# Patient Record
Sex: Male | Born: 1945 | Race: Asian | Hispanic: No | Marital: Married | Smoking: Former smoker
Health system: Southern US, Community
[De-identification: ages and names within clinical notes are randomized; demographics above are authoritative.]

## PROBLEM LIST (undated history)

## (undated) DIAGNOSIS — B1920 Unspecified viral hepatitis C without hepatic coma: Secondary | ICD-10-CM

## (undated) DIAGNOSIS — I4891 Unspecified atrial fibrillation: Secondary | ICD-10-CM

## (undated) DIAGNOSIS — N179 Acute kidney failure, unspecified: Secondary | ICD-10-CM

## (undated) DIAGNOSIS — K625 Hemorrhage of anus and rectum: Secondary | ICD-10-CM

## (undated) DIAGNOSIS — C220 Liver cell carcinoma: Secondary | ICD-10-CM

## (undated) DIAGNOSIS — J90 Pleural effusion, not elsewhere classified: Secondary | ICD-10-CM

## (undated) DIAGNOSIS — I10 Essential (primary) hypertension: Secondary | ICD-10-CM

## (undated) DIAGNOSIS — I059 Rheumatic mitral valve disease, unspecified: Secondary | ICD-10-CM

## (undated) DIAGNOSIS — G4733 Obstructive sleep apnea (adult) (pediatric): Secondary | ICD-10-CM

## (undated) DIAGNOSIS — R0600 Dyspnea, unspecified: Secondary | ICD-10-CM

## (undated) DIAGNOSIS — N529 Male erectile dysfunction, unspecified: Secondary | ICD-10-CM

## (undated) DIAGNOSIS — I080 Rheumatic disorders of both mitral and aortic valves: Secondary | ICD-10-CM

## (undated) DIAGNOSIS — I714 Abdominal aortic aneurysm, without rupture, unspecified: Secondary | ICD-10-CM

## (undated) DIAGNOSIS — I429 Cardiomyopathy, unspecified: Secondary | ICD-10-CM

## (undated) DIAGNOSIS — I5021 Acute systolic (congestive) heart failure: Secondary | ICD-10-CM

## (undated) DIAGNOSIS — Z7901 Long term (current) use of anticoagulants: Secondary | ICD-10-CM

## (undated) DIAGNOSIS — N400 Enlarged prostate without lower urinary tract symptoms: Secondary | ICD-10-CM

## (undated) DIAGNOSIS — Z8719 Personal history of other diseases of the digestive system: Secondary | ICD-10-CM

## (undated) DIAGNOSIS — R319 Hematuria, unspecified: Secondary | ICD-10-CM

## (undated) DIAGNOSIS — K219 Gastro-esophageal reflux disease without esophagitis: Secondary | ICD-10-CM

## (undated) DIAGNOSIS — R063 Periodic breathing: Secondary | ICD-10-CM

## (undated) HISTORY — DX: Unspecified atrial fibrillation: I48.91

## (undated) HISTORY — DX: Essential (primary) hypertension: I10

## (undated) HISTORY — DX: Hematuria, unspecified: R31.9

## (undated) HISTORY — DX: Liver cell carcinoma: C22.0

## (undated) HISTORY — PX: AORTIC VALVE REPLACEMENT: SHX41

## (undated) HISTORY — DX: Rheumatic mitral valve disease, unspecified: I05.9

## (undated) HISTORY — DX: Personal history of other diseases of the digestive system: Z87.19

## (undated) HISTORY — DX: Pleural effusion, not elsewhere classified: J90

## (undated) HISTORY — DX: Unspecified viral hepatitis C without hepatic coma: B19.20

## (undated) HISTORY — DX: Cardiomyopathy, unspecified: I42.9

## (undated) HISTORY — DX: Rheumatic disorders of both mitral and aortic valves: I08.0

## (undated) HISTORY — DX: Acute systolic (congestive) heart failure: I50.21

## (undated) HISTORY — DX: Abdominal aortic aneurysm, without rupture: I71.4

## (undated) HISTORY — DX: Gastro-esophageal reflux disease without esophagitis: K21.9

## (undated) HISTORY — DX: Male erectile dysfunction, unspecified: N52.9

## (undated) HISTORY — DX: Abdominal aortic aneurysm, without rupture, unspecified: I71.40

## (undated) HISTORY — DX: Acute kidney failure, unspecified: N17.9

## (undated) HISTORY — DX: Periodic breathing: R06.3

## (undated) HISTORY — DX: Long term (current) use of anticoagulants: Z79.01

## (undated) HISTORY — DX: Benign prostatic hyperplasia without lower urinary tract symptoms: N40.0

## (undated) HISTORY — DX: Obstructive sleep apnea (adult) (pediatric): G47.33

## (undated) HISTORY — DX: Hemorrhage of anus and rectum: K62.5

## (undated) HISTORY — DX: Dyspnea, unspecified: R06.00

---

## 2010-09-27 HISTORY — PX: OTHER SURGICAL HISTORY: SHX169

## 2011-05-19 DIAGNOSIS — I059 Rheumatic mitral valve disease, unspecified: Secondary | ICD-10-CM

## 2011-05-19 DIAGNOSIS — I359 Nonrheumatic aortic valve disorder, unspecified: Secondary | ICD-10-CM

## 2011-06-01 ENCOUNTER — Encounter: Payer: Self-pay | Admitting: Thoracic Surgery (Cardiothoracic Vascular Surgery)

## 2011-06-16 DIAGNOSIS — I359 Nonrheumatic aortic valve disorder, unspecified: Secondary | ICD-10-CM

## 2011-06-23 ENCOUNTER — Other Ambulatory Visit: Payer: Self-pay | Admitting: Physician Assistant

## 2011-06-23 DIAGNOSIS — F5102 Adjustment insomnia: Secondary | ICD-10-CM

## 2011-06-23 NOTE — Telephone Encounter (Signed)
Pt called requesting medication for sleep. Rx called to [facility] [address] [place] for Ambien 10mg  #30 1 po qhs PRN with no refill.

## 2011-07-14 ENCOUNTER — Ambulatory Visit (INDEPENDENT_AMBULATORY_CARE_PROVIDER_SITE_OTHER): Payer: Medicare Other | Admitting: Thoracic Surgery (Cardiothoracic Vascular Surgery)

## 2011-07-14 DIAGNOSIS — Z952 Presence of prosthetic heart valve: Secondary | ICD-10-CM

## 2011-07-14 DIAGNOSIS — I359 Nonrheumatic aortic valve disorder, unspecified: Secondary | ICD-10-CM

## 2011-07-14 NOTE — Progress Notes (Signed)
The patient is doing quite well and has had no problems since discharge. His breathing is much improved. He is walking for exercise. His sternum is stable and his wounds are well healed.He is walking for exercise.  Sternal precautions were reviewed with the patient and his son. Will continue follow up with cardiology.RTC prn

## 2012-08-13 ENCOUNTER — Other Ambulatory Visit: Payer: Self-pay | Admitting: Internal Medicine

## 2012-12-15 ENCOUNTER — Other Ambulatory Visit: Payer: Self-pay | Admitting: Gastroenterology

## 2012-12-15 DIAGNOSIS — C220 Liver cell carcinoma: Secondary | ICD-10-CM

## 2012-12-21 ENCOUNTER — Ambulatory Visit
Admission: RE | Admit: 2012-12-21 | Discharge: 2012-12-21 | Disposition: A | Discharge status: Home / Self Care | Payer: Medicare HMO | Source: Ambulatory Visit | Attending: Gastroenterology | Admitting: Gastroenterology

## 2012-12-21 DIAGNOSIS — C220 Liver cell carcinoma: Secondary | ICD-10-CM

## 2012-12-22 ENCOUNTER — Telehealth: Payer: Self-pay | Admitting: Emergency Medicine

## 2012-12-22 NOTE — Telephone Encounter (Signed)
LM FOR RN TO CALL BACK TO SEE IF PT CAN STOP HIS COUMADIN X4D FOR LIVER BX.  12-25-12 KEVIN, PHARM-D CALLED  FROM DR [facility] OFFICE  TO MAKE [place] AWARE THAT PT. CAN STOP COUMADIN BUT WILL NEED TO Jyles BRIDGED TO LOVENOX INJECTIONS.  I WILL CALL KEVIN BACK WHEN THE LIVER BX IS SCHEDULED.

## 2012-12-25 ENCOUNTER — Telehealth: Payer: Self-pay | Admitting: Emergency Medicine

## 2012-12-25 ENCOUNTER — Other Ambulatory Visit (HOSPITAL_COMMUNITY): Payer: Self-pay | Admitting: Interventional Radiology

## 2012-12-25 DIAGNOSIS — R160 Hepatomegaly, not elsewhere classified: Secondary | ICD-10-CM

## 2012-12-25 DIAGNOSIS — R772 Abnormality of alphafetoprotein: Secondary | ICD-10-CM

## 2012-12-25 DIAGNOSIS — B182 Chronic viral hepatitis C: Secondary | ICD-10-CM

## 2012-12-25 NOTE — Telephone Encounter (Signed)
LM FOR PT TO CALL BACK TO GET THE INFORMATION AND APPT. FOR LIVER BX- SCHEDULED AT [facility] ON 01-01-13 AT 1000AM AND [place] AT SHORT STAY AT 830AM.  NPO AFTER MIDNIGHT AND CARDIO OFFICE WILL CALL WITH INSTRUCTIONS FOR LOVENOX.    1400- CALLED LUWANDA TO SCHEDULED AN INTERPRETER FOR BX PROCEDURE   1410P- LM W/ PEGGY IN THE COUMADIN CLINIC ([phone])WITH THE LIVER BX DATE/TIME  1445- spoke with Orvilla Fus- Son and gave him instructions and appt. For Bx , also told him that an interpreter will Bradely there for the procedure  For liability reasons.

## 2012-12-28 ENCOUNTER — Other Ambulatory Visit: Payer: Self-pay | Admitting: Radiology

## 2012-12-29 ENCOUNTER — Encounter (INDEPENDENT_AMBULATORY_CARE_PROVIDER_SITE_OTHER): Payer: Self-pay

## 2013-01-01 ENCOUNTER — Ambulatory Visit (HOSPITAL_COMMUNITY)
Admission: RE | Admit: 2013-01-01 | Discharge: 2013-01-01 | Disposition: A | Discharge status: Home / Self Care | Payer: Medicare HMO | Source: Ambulatory Visit | Attending: Interventional Radiology | Admitting: Interventional Radiology

## 2013-01-01 ENCOUNTER — Encounter (HOSPITAL_COMMUNITY): Payer: Self-pay

## 2013-01-01 DIAGNOSIS — K219 Gastro-esophageal reflux disease without esophagitis: Secondary | ICD-10-CM | POA: Insufficient documentation

## 2013-01-01 DIAGNOSIS — I080 Rheumatic disorders of both mitral and aortic valves: Secondary | ICD-10-CM | POA: Insufficient documentation

## 2013-01-01 DIAGNOSIS — I10 Essential (primary) hypertension: Secondary | ICD-10-CM | POA: Insufficient documentation

## 2013-01-01 DIAGNOSIS — R109 Unspecified abdominal pain: Secondary | ICD-10-CM | POA: Insufficient documentation

## 2013-01-01 DIAGNOSIS — B1920 Unspecified viral hepatitis C without hepatic coma: Secondary | ICD-10-CM | POA: Insufficient documentation

## 2013-01-01 DIAGNOSIS — K7689 Other specified diseases of liver: Secondary | ICD-10-CM | POA: Insufficient documentation

## 2013-01-01 DIAGNOSIS — I714 Abdominal aortic aneurysm, without rupture, unspecified: Secondary | ICD-10-CM | POA: Insufficient documentation

## 2013-01-01 DIAGNOSIS — I4891 Unspecified atrial fibrillation: Secondary | ICD-10-CM | POA: Insufficient documentation

## 2013-01-01 DIAGNOSIS — C228 Malignant neoplasm of liver, primary, unspecified as to type: Secondary | ICD-10-CM | POA: Insufficient documentation

## 2013-01-01 DIAGNOSIS — R772 Abnormality of alphafetoprotein: Secondary | ICD-10-CM

## 2013-01-01 DIAGNOSIS — B182 Chronic viral hepatitis C: Secondary | ICD-10-CM

## 2013-01-01 DIAGNOSIS — R160 Hepatomegaly, not elsewhere classified: Secondary | ICD-10-CM

## 2013-01-01 DIAGNOSIS — N400 Enlarged prostate without lower urinary tract symptoms: Secondary | ICD-10-CM | POA: Insufficient documentation

## 2013-01-01 LAB — CBC
Platelets: 179 10*3/uL (ref 150–400)
RBC: 4.21 MIL/uL — ABNORMAL LOW (ref 4.22–5.81)
WBC: 8.4 10*3/uL (ref 4.0–10.5)

## 2013-01-01 LAB — APTT: aPTT: 31 seconds (ref 24–37)

## 2013-01-01 LAB — PROTIME-INR: Prothrombin Time: 13.3 seconds (ref 11.6–15.2)

## 2013-01-01 MED ORDER — MIDAZOLAM HCL 2 MG/2ML IJ SOLN
INTRAMUSCULAR | Status: AC
  Filled 2013-01-01: qty 4

## 2013-01-01 MED ORDER — FENTANYL CITRATE 0.05 MG/ML IJ SOLN
INTRAMUSCULAR | Status: AC
  Filled 2013-01-01: qty 2

## 2013-01-01 MED ORDER — SODIUM CHLORIDE 0.9 % IV SOLN: Freq: Once | INTRAVENOUS | Status: DC

## 2013-01-01 MED ORDER — MIDAZOLAM HCL 2 MG/2ML IJ SOLN
INTRAMUSCULAR | Status: AC | PRN
  Administered 2013-01-01: 0.5 mg via INTRAVENOUS
  Administered 2013-01-01: 1 mg via INTRAVENOUS
  Administered 2013-01-01: 0.5 mg via INTRAVENOUS

## 2013-01-01 MED ORDER — FENTANYL CITRATE 0.05 MG/ML IJ SOLN
INTRAMUSCULAR | Status: AC | PRN
  Administered 2013-01-01 (×2): 25 ug via INTRAVENOUS
  Administered 2013-01-01: 50 ug via INTRAVENOUS

## 2013-01-01 NOTE — Procedures (Signed)
Technically successful US guided biopsy of hypoechoic lesion in inf aspect of the left lobe of the liver.  No immediate complications.

## 2013-01-01 NOTE — ED Notes (Signed)
Patient denies pain and is resting comfortably.  

## 2013-01-01 NOTE — H&P (Signed)
Glen Pitts is an 67 y.o. male.   Chief Complaint: Abd pain x 1 month; AFP elevated; liver lesion Was consulted with Dr Grace Isaac 12/21/12 Regarding treatment of liver lesion Scheduled now though for liver lesion biopsy first Possible Y90 or ablation scheduled for possible treatment of lesion HPI: non English speaking; has son and interpreter with him HTN; Hep C; BPH; AAA; Afib  Past Medical History  Diagnosis Date  . Hypertension   . Benign prostatic hypertrophy   . History of esophageal reflux   . Mitral and aortic regurgitation     History reviewed. No pertinent past surgical history.  No family history on file. Social History:  has no tobacco, alcohol, and drug history on file.  Allergies: No Known Allergies   (Not in a hospital admission)  No results found for this or any previous visit (from the past 48 hour(s)). No results found.  Review of Systems  Constitutional: Negative for fever and weight loss.  Respiratory: Negative for shortness of breath.   Cardiovascular: Negative for chest pain.  Gastrointestinal: Positive for abdominal pain. Negative for nausea and vomiting.  Musculoskeletal: Positive for back pain.  Neurological: Negative for dizziness, weakness and headaches.    Blood pressure 132/79, pulse 51, temperature 97.5 F (36.4 C), temperature source Oral, resp. rate 18, height 5\' 5"  (1.651 m), weight 150 lb (68.04 kg), SpO2 97.00%. Physical Exam  Constitutional: He is oriented to person, place, and time.  Cardiovascular: Normal rate, regular rhythm and normal heart sounds.   No murmur heard. Respiratory: Effort normal and breath sounds normal.  GI: Soft. Bowel sounds are normal. There is tenderness.  Musculoskeletal: Normal range of motion.  Neurological: He is alert and oriented to person, place, and time.  Does not speak [place]; uses interpreter  Skin: Skin is warm.  Psychiatric: He has a normal mood and affect. His behavior is normal. Judgment and thought  content normal.  Non English speaking; Son and interpreter with him     Assessment/Plan Liver lesion; abd pain Consulted with Dr Grace Isaac 3/27; for possible  treatment of lesion  Scheduled now though for liver lesion biopsy first Pt and family aware of procedure benefits and risks and agreeable to proceed Consent signed and in chart  Karsynn Deweese A 01/01/2013, 9:37 AM

## 2013-01-01 NOTE — ED Notes (Signed)
Requested bed from [facility]; spoke with Misty Stanley RN

## 2013-01-05 ENCOUNTER — Encounter (INDEPENDENT_AMBULATORY_CARE_PROVIDER_SITE_OTHER): Payer: Self-pay | Admitting: General Surgery

## 2013-01-05 ENCOUNTER — Telehealth (INDEPENDENT_AMBULATORY_CARE_PROVIDER_SITE_OTHER): Payer: Self-pay

## 2013-01-05 ENCOUNTER — Ambulatory Visit (INDEPENDENT_AMBULATORY_CARE_PROVIDER_SITE_OTHER): Payer: Medicaid Other | Admitting: General Surgery

## 2013-01-05 VITALS — BP 110/68 | HR 80 | Resp 18 | Ht 64.0 in | Wt 152.0 lb

## 2013-01-05 DIAGNOSIS — C228 Malignant neoplasm of liver, primary, unspecified as to type: Secondary | ICD-10-CM

## 2013-01-05 DIAGNOSIS — C220 Liver cell carcinoma: Secondary | ICD-10-CM

## 2013-01-05 NOTE — Telephone Encounter (Signed)
Contacted Dr. Radene Journey office to get name of this pt's cardiologist.  LM because office was closed.  Will call again next week.

## 2013-01-05 NOTE — Patient Instructions (Addendum)
We will fax letter to cardiologist.    If he says low risk for surgery, then we will schedule surgery.     Assuming we proceed with surgery.   Whatever the operative findings, I will discuss the case with your family after we are done in the operating room.  I will talk to you in the next few days when you are more awake.  I will see you in the hospital every weekday that I am not out of town.  My partners help see patients on the weekends and if I am out of town.    WHAT HAPPENS AFTER SURGERY: After surgery, you will go first to the recovery room, then to your room.   YOU WILL NOT Erol ABLE TO EAT FOR AT LEAST 1-3 DAYS AFTER SURGERY.  You will have a catheter in your bladder.  On your abdomen, you will have several surgical drains and possibly a pain pump with numbing medicine.  You will have compression stockings on your legs to decrease the risk of blood clots.    We will address your pain in several ways.  We will use an IV pain pump called a "PCA," or Patient Controlled Analgesia.  This allows you to press a button and immediately receive a dose of pain medication without waiting for a nurse.  We also use IV Tylenol and sometimes IV Toradol which is similar to ibuprofen.  You may also have a pump with numbing medicine delivered directly to your incision.  I use doses and medications that work for the majority of people, but you may need an adjustment to the dose or type of medicine if your pain is not adequately controlled.  Your throat may Nechemia sore, in which case you may need a throat spray or lozenges.    We will ask you to get out of bed the day after surgery in order to maximize your chances of not having complications.  Your risk of pneumonia and blood clots is lower with walking and sitting in the chair.  We will also ask you to perform breathing exercises.  We will also ask to you walk in your room and in the halls for the above reasons, but also in order for you to keep up your strength.     EATING: We will usually start you on clear liquids in around 1-2 days if your bowel function seems to have returned.  We advance your diet slowly to make sure you are tolerating each step.  All patients do not have a normal appetite when they go home and usually have to take 2-4 cans of nutritional supplement per day while this is improving.  Most patients also find that their taste buds do not seem the same right after surgery, and this can continue into the time of possible post operative chemotherapy and radiation.  Some patients develop diabetes and will need assistance from a primary care doctor for medication.     GOING HOME! Usually you are able to go home in 3-5 days, depending on whether or not complications happen and what is going on with your overall health status.   If you have more health problems or if you have limited help at home, the therapists and nurses may recommend a temporary rehab or nursing facility to help you get back on your feet before you go home.  These decisions would Rykker made while you are in the hospital with the assistance of a social worker or case  Production designer, theatre/television/film.    Please bring all insurance/disability forms to our office for the staff to fill out   POSSIBLE COMPLICATIONS This is a very extensive operation and includes complications listed below: Bleeding Infection and possible wound complications such as hernia Damage to adjacent structures Leak of bile from the surface of the liver Possible need for other procedures, such as abscess drains in radiology or endoscopy.   Possible prolonged hospital stay MOST PATIENTS' ENERGY LEVEL IS NOT BACK TO NORMAL FOR AT LEAST 4-6 MONTHS.  OLDER PATIENTS MAY FEEL WEAK FOR LONGER PERIODS OF TIME.   Difficulty with eating or post operative nausea (around 30%) Possible early recurrence of cancer Possible complications of your medical problems such as heart disease or arrhythmias. Death (less than 2%)  All possible  complications are not listed, just the most common.    FURTHER INFORMATION? Please ask questions if you find something that we did not discuss in the office and would like more information.  If you would like another appointment if you have many questions or if your family members would like to come as well, please contact the office.     IF YOU ARE TAKING ASPIRIN, PLAVIX, COUMADIN, OR OTHER BLOOD THINNERS, LET us KNOW IMMEDIATELY SO WE CAN CONTACT YOUR PRESCRIBING HEALTH CARE PROVIDER TO HOLD THE MEDICATION FOR 5-7 DAYS BEFORE SURGERY

## 2013-01-08 DIAGNOSIS — C220 Liver cell carcinoma: Secondary | ICD-10-CM | POA: Insufficient documentation

## 2013-01-08 NOTE — Progress Notes (Signed)
Chief Complaint  Patient presents with  . Other    Eval liver mass    HISTORY: Pt is 67 yo [place] male who presents with new left sided liver mass.  This was discovered on an abdominal ultrasound performed for abdominal discomfort.  He has history of hepatitis C.  He subsequently underwent MR demonstrating isolated liver mass in left lateral segment.  No other lesions have been seen.  He denies jaundice.  He has had some decreased appetite.  No N/v.  AFP elevated in the 900s.  Biopsy performed and was positive for HCC.  He has not had ascites or encephalopathy that he or his son is aware of.  He does have some cirrhosis.  He is on coumadin for mechanical valve.    Past Medical History  Diagnosis Date  . Hypertension   . Benign prostatic hypertrophy   . History of esophageal reflux   . Mitral and aortic regurgitation   Hepatitis C  Past Surgical History  Procedure Laterality Date  . Open heart surgery  2012    Current Outpatient Prescriptions  Medication Sig Dispense Refill  . amitriptyline (ELAVIL) 25 MG tablet Take 25 mg by mouth at bedtime.      . bimatoprost (LUMIGAN) 0.03 % ophthalmic solution Place 1 drop into both eyes at bedtime.      . dorzolamide-timolol (COSOPT) 22.3-6.8 MG/ML ophthalmic solution Place 1 drop into both eyes 2 (two) times daily.      . furosemide (LASIX) 20 MG tablet Take 20 mg by mouth 2 (two) times daily.      Marland Kitchen lisinopril (PRINIVIL,ZESTRIL) 5 MG tablet Take 5 mg by mouth daily.      . metoprolol succinate (TOPROL-XL) 25 MG 24 hr tablet Take 50 mg by mouth daily.      Marland Kitchen warfarin (COUMADIN) 2.5 MG tablet Take 1.25-2.5 mg by mouth daily. Takes 1.25 mg on Tues,Thurs, Sat.  Takes 2.5 mg all other days.       No current facility-administered medications for this visit.     No Known Allergies   History reviewed. No pertinent family history.   History   Social History  . Marital Status: Married    Spouse Name: N/A    Number of Children: N/A  .  Years of Education: N/A   Social History Main Topics  . Smoking status: Former Smoker -- 0 years    Types: Cigarettes  . Smokeless tobacco: None  . Alcohol Use: No  . Drug Use: No  . Sexually Active: None   Other Topics Concern  . None   Social History Narrative  . None     REVIEW OF SYSTEMS - PERTINENT POSITIVES ONLY: 12 point review of systems negative other than HPI and PMH except for headaches  EXAM: Filed Vitals:   01/05/13 0941  BP: 110/68  Pulse: 80  Resp: 18    Gen:  No acute distress.  Well nourished and well groomed.  Sl frail.   Neurological: Alert and oriented to person, place, and time. Coordination normal. Sl difficult via interpreter, but pt seems oriented.   Head: Normocephalic and atraumatic.  Eyes: Conjunctivae are normal. Pupils are equal, round, and reactive to light. No scleral icterus.  Neck: Normal range of motion. Neck supple. No tracheal deviation or thyromegaly present.  Cardiovascular: Normal rate, regular rhythm, normal heart sounds and intact distal pulses.  Exam reveals no gallop and no friction rub.  No murmur heard. Respiratory: Effort normal.  No respiratory distress.  No chest wall tenderness. Breath sounds normal.  No wheezes, rales or rhonchi.  GI: Soft. Bowel sounds are normal. The abdomen is soft and nontender.  There is no rebound and no guarding. No ascites evident.   Musculoskeletal: Sl antalgic gait. Extremities are nontender.  Lymphadenopathy: No cervical, preauricular, postauricular or axillary adenopathy is present Skin: Skin is warm and dry. No rash noted. No diaphoresis. No erythema. No pallor. No clubbing, cyanosis, or edema.   Psychiatric: Normal mood and affect. Behavior is normal. Judgment and thought content normal.    LABORATORY RESULTS: Available labs are reviewed  Path poorly differentiated hepatocellular carcinoma.     RADIOLOGY RESULTS: See E-Chart or I-Site for most recent results.  Images and reports are  reviewed.  [place] but images able to [place] reviewed.  2.9 cm lesion in left lateral segment.  Small simple cyst.  No other liver masses.    ASSESSMENT AND PLAN: Cancer, hepatocellular Pt has isolated left lateral segment lesion.   Discussed pros and cons of resection vs interventional technique.  Pt seems reasonable candidate for laparoscopic resection of lesion. Will get cardiology input.   If low to medium cardiac risk for surgery, will hold coumadin with lovenox bridge and plan resection.   Reviewed risk of bleeding (high), infection, damage to adjacent structures, liver failure, bile leak, ascites, cardiac complications, blood clot, wound issues.    I think patient would do poorly with large resection, but it only appears he would lose around 15% of liver to resect this lesion.   Once I hear from cardiology, I will schedule.       Maudry Diego MD Surgical Oncology, General and Endocrine Surgery [facility]    45 min spent with interpreter phone with discussion and exam.    Visit Diagnoses: 1. Cancer, hepatocellular     Primary Care Physician: Zoila Shutter, MD

## 2013-01-08 NOTE — Assessment & Plan Note (Signed)
Pt has isolated left lateral segment lesion.   Discussed pros and cons of resection vs interventional technique.  Pt seems reasonable candidate for laparoscopic resection of lesion. Will get cardiology input.   If low to medium cardiac risk for surgery, will hold coumadin with lovenox bridge and plan resection.   Reviewed risk of bleeding (high), infection, damage to adjacent structures, liver failure, bile leak, ascites, cardiac complications, blood clot, wound issues.    I think patient would do poorly with large resection, but it only appears he would lose around 15% of liver to resect this lesion.   Once I hear from cardiology, I will schedule.

## 2013-01-11 ENCOUNTER — Encounter (INDEPENDENT_AMBULATORY_CARE_PROVIDER_SITE_OTHER): Payer: Self-pay

## 2013-02-06 ENCOUNTER — Encounter (INDEPENDENT_AMBULATORY_CARE_PROVIDER_SITE_OTHER): Payer: Self-pay

## 2013-02-21 ENCOUNTER — Other Ambulatory Visit: Payer: Self-pay | Admitting: Internal Medicine

## 2013-02-21 DIAGNOSIS — C228 Malignant neoplasm of liver, primary, unspecified as to type: Secondary | ICD-10-CM

## 2013-03-06 ENCOUNTER — Other Ambulatory Visit: Payer: Self-pay | Admitting: Emergency Medicine

## 2013-03-06 ENCOUNTER — Ambulatory Visit
Admission: RE | Admit: 2013-03-06 | Discharge: 2013-03-06 | Disposition: A | Discharge status: Home / Self Care | Payer: Medicare HMO | Source: Ambulatory Visit | Attending: Internal Medicine | Admitting: Internal Medicine

## 2013-03-06 DIAGNOSIS — C220 Liver cell carcinoma: Secondary | ICD-10-CM

## 2013-03-06 DIAGNOSIS — C228 Malignant neoplasm of liver, primary, unspecified as to type: Secondary | ICD-10-CM

## 2013-03-06 LAB — BUN: BUN: 26 mg/dL — ABNORMAL HIGH (ref 6–23)

## 2013-03-06 NOTE — Progress Notes (Signed)
Here to discuss Rx of Liver Mass.  Accompanied by son & licensed interpretor.  Appetite: good.  Denies nausea, vomiting or diarrhea.    Denies flank pain or abdominal pain.    Exercise:  Walks about 45 minutes Qd, outside and/or on treadmill.  Able to complete ADL's with rest intervals as needed.

## 2013-03-07 LAB — CREATININE WITH EST GFR: GFR, Est Non African American: 62 mL/min

## 2013-03-15 ENCOUNTER — Other Ambulatory Visit (HOSPITAL_COMMUNITY): Payer: Self-pay | Admitting: Interventional Radiology

## 2013-03-15 DIAGNOSIS — C220 Liver cell carcinoma: Secondary | ICD-10-CM

## 2013-03-21 ENCOUNTER — Other Ambulatory Visit: Payer: Medicare HMO

## 2013-03-27 ENCOUNTER — Ambulatory Visit
Admission: RE | Admit: 2013-03-27 | Discharge: 2013-03-27 | Disposition: A | Discharge status: Home / Self Care | Payer: Medicare HMO | Source: Ambulatory Visit | Attending: Interventional Radiology | Admitting: Interventional Radiology

## 2013-03-27 DIAGNOSIS — C220 Liver cell carcinoma: Secondary | ICD-10-CM

## 2013-03-27 NOTE — Progress Notes (Signed)
Afebrile.  Occasional chills.    Appetite:  Fair-good.  Weight:  Stable.  Denies nausea, vomiting or diarrhea.  Denies pain or bloating.  Occasional headache Takes Tylenol 1-2 tabs po prn as needed.  Endurance:  Fatigues easily.  Able to complete ADL's with rest intervals as needed.  Sleeping:  Fair.    Lynk Marti Carmell Austria, RN 03/27/2013 1:06 PM

## 2013-03-29 ENCOUNTER — Other Ambulatory Visit (HOSPITAL_COMMUNITY): Payer: Self-pay | Admitting: Interventional Radiology

## 2013-03-29 DIAGNOSIS — C220 Liver cell carcinoma: Secondary | ICD-10-CM

## 2013-03-29 DIAGNOSIS — D259 Leiomyoma of uterus, unspecified: Secondary | ICD-10-CM

## 2013-04-05 ENCOUNTER — Telehealth: Payer: Self-pay | Admitting: Emergency Medicine

## 2013-04-05 NOTE — Telephone Encounter (Signed)
CALLED TOMMY TO MAKE HIM AWARE THAT HIS DAD'S INSURANCE HAS BEEN APPROVED FOR THE Y-90. TINA AT [facility] WILL CONTACT HIM TO SET UP THE APPOINTMENTS.

## 2013-04-09 ENCOUNTER — Other Ambulatory Visit (HOSPITAL_COMMUNITY): Payer: Self-pay | Admitting: Interventional Radiology

## 2013-04-09 DIAGNOSIS — C220 Liver cell carcinoma: Secondary | ICD-10-CM

## 2013-04-11 ENCOUNTER — Other Ambulatory Visit: Payer: Self-pay | Admitting: Interventional Radiology

## 2013-04-11 DIAGNOSIS — C220 Liver cell carcinoma: Secondary | ICD-10-CM

## 2013-04-13 ENCOUNTER — Other Ambulatory Visit: Payer: Self-pay | Admitting: Radiology

## 2013-04-18 ENCOUNTER — Encounter (HOSPITAL_COMMUNITY): Payer: Self-pay

## 2013-04-18 ENCOUNTER — Other Ambulatory Visit (HOSPITAL_COMMUNITY): Payer: Self-pay | Admitting: Interventional Radiology

## 2013-04-18 ENCOUNTER — Encounter (HOSPITAL_COMMUNITY)
Admission: RE | Admit: 2013-04-18 | Discharge: 2013-04-18 | Disposition: A | Discharge status: Home / Self Care | Payer: Medicare HMO | Source: Ambulatory Visit | Attending: Interventional Radiology | Admitting: Interventional Radiology

## 2013-04-18 ENCOUNTER — Ambulatory Visit (HOSPITAL_COMMUNITY)
Admission: RE | Admit: 2013-04-18 | Discharge: 2013-04-18 | Disposition: A | Discharge status: Home / Self Care | Payer: Medicare HMO | Source: Ambulatory Visit | Attending: Interventional Radiology | Admitting: Interventional Radiology

## 2013-04-18 DIAGNOSIS — C228 Malignant neoplasm of liver, primary, unspecified as to type: Secondary | ICD-10-CM | POA: Insufficient documentation

## 2013-04-18 DIAGNOSIS — C220 Liver cell carcinoma: Secondary | ICD-10-CM

## 2013-04-18 LAB — CBC
HCT: 39.7 % (ref 39.0–52.0)
RDW: 14 % (ref 11.5–15.5)
WBC: 8.2 10*3/uL (ref 4.0–10.5)

## 2013-04-18 LAB — COMPREHENSIVE METABOLIC PANEL
AST: 89 U/L — ABNORMAL HIGH (ref 0–37)
Albumin: 3.8 g/dL (ref 3.5–5.2)
Alkaline Phosphatase: 108 U/L (ref 39–117)
BUN: 19 mg/dL (ref 6–23)
Chloride: 105 mEq/L (ref 96–112)
Potassium: 4 mEq/L (ref 3.5–5.1)
Total Bilirubin: 0.6 mg/dL (ref 0.3–1.2)

## 2013-04-18 LAB — APTT: aPTT: 28 seconds (ref 24–37)

## 2013-04-18 MED ORDER — ONDANSETRON HCL 4 MG/2ML IJ SOLN: 4.0000 mg | Freq: Once | INTRAMUSCULAR | Status: DC

## 2013-04-18 MED ORDER — MIDAZOLAM HCL 2 MG/2ML IJ SOLN
INTRAMUSCULAR | Status: AC | PRN
  Administered 2013-04-18 (×2): 1 mg via INTRAVENOUS

## 2013-04-18 MED ORDER — FENTANYL CITRATE 0.05 MG/ML IJ SOLN
INTRAMUSCULAR | Status: AC | PRN
  Administered 2013-04-18 (×2): 50 ug via INTRAVENOUS
  Administered 2013-04-18: 100 ug via INTRAVENOUS

## 2013-04-18 MED ORDER — FENTANYL CITRATE 0.05 MG/ML IJ SOLN
INTRAMUSCULAR | Status: AC
  Filled 2013-04-18: qty 8

## 2013-04-18 MED ORDER — ONDANSETRON HCL 4 MG/2ML IJ SOLN
INTRAMUSCULAR | Status: AC
  Filled 2013-04-18: qty 2

## 2013-04-18 MED ORDER — SODIUM CHLORIDE 0.9 % IV SOLN
Freq: Once | INTRAVENOUS | Status: AC
  Administered 2013-04-18: 08:00:00 via INTRAVENOUS

## 2013-04-18 MED ORDER — MIDAZOLAM HCL 2 MG/2ML IJ SOLN
INTRAMUSCULAR | Status: AC
  Filled 2013-04-18: qty 8

## 2013-04-18 MED ORDER — TECHNETIUM TO 99M ALBUMIN AGGREGATED
4.6000 | Freq: Once | INTRAVENOUS | Status: AC | PRN
  Administered 2013-04-18: 4.6 via INTRAVENOUS

## 2013-04-18 MED ORDER — ONDANSETRON HCL 4 MG/2ML IJ SOLN
INTRAMUSCULAR | Status: AC | PRN
  Administered 2013-04-18: 4 mg via INTRAVENOUS

## 2013-04-18 MED ORDER — HEPARIN SODIUM (PORCINE) 1000 UNIT/ML IJ SOLN: 1000.0000 [IU] | Freq: Once | INTRAMUSCULAR | Status: DC

## 2013-04-18 MED ORDER — IOHEXOL 300 MG/ML  SOLN
80.0000 mL | Freq: Once | INTRAMUSCULAR | Status: AC | PRN
  Administered 2013-04-18: 200 mL via INTRA_ARTERIAL

## 2013-04-18 MED ORDER — HEPARIN SODIUM (PORCINE) 1000 UNIT/ML IJ SOLN
INTRAMUSCULAR | Status: AC
  Filled 2013-04-18: qty 1

## 2013-04-18 NOTE — H&P (Signed)
Chief Complaint: "I'm here for liver treatment" Referring Physician:Byerly HPI: Glen Pitts is an 67 y.o. male who has been seen by Dr. Grace Isaac in consult for consideration of Y-90 treatment of HCC. Please see IR Rad Eval in PACS for full details of Dr. Grace Isaac eval. He is doing well and is scheduled today. 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 PMHx and meds reviewed. Denies recent fevers, illness, CP, SOB  Past Medical History:  Past Medical History  Diagnosis Date  . Hypertension   . Benign prostatic hypertrophy   . History of esophageal reflux   . Mitral and aortic regurgitation   . Atrial fibrillation   . AAA (abdominal aortic aneurysm)   . Acute renal failure   . Acute systolic congestive heart failure   . Cheyne-Stokes respiration   . Cardiomyopathy   . Erectile dysfunction   . GERD (gastroesophageal reflux disease)   . Hematuria   . Hepatitis C virus   . Hepatocellular carcinoma   . Mitral valve disorder   . Obstructive sleep apnea   . Pleural effusion   . Dyspnea   . Benign hypertension   . Anticoagulant long-term use   . Rectal bleeding     Past Surgical History:  Past Surgical History  Procedure Laterality Date  . Open heart surgery  2012  . Aortic valve replacement      Family History: History reviewed. No pertinent family history.  Social History:  reports that he has quit smoking. His smoking use included Cigarettes. He smoked 0.00 packs per day for 0 years. He does not have any smokeless tobacco history on file. He reports that  drinks alcohol. He reports that he does not use illicit drugs.  Allergies: No Known Allergies  Medications:   Medication List    ASK your doctor about these medications       amitriptyline 25 MG tablet  Commonly known as:  ELAVIL  Take 25 mg by mouth at bedtime.     bimatoprost 0.03 % ophthalmic solution  Commonly known as:   LUMIGAN  Place 1 drop into both eyes at bedtime.     dorzolamide-timolol 22.3-6.8 MG/ML ophthalmic solution  Commonly known as:  COSOPT  Place 1 drop into both eyes 2 (two) times daily.     furosemide 20 MG tablet  Commonly known as:  LASIX  Take 20 mg by mouth 2 (two) times daily.     lisinopril 5 MG tablet  Commonly known as:  PRINIVIL,ZESTRIL  Take 5 mg by mouth daily.     metoprolol succinate 25 MG 24 hr tablet  Commonly known as:  TOPROL-XL  Take 50 mg by mouth daily.     warfarin 2.5 MG tablet  Commonly known as:  COUMADIN  Take 1.25-2.5 mg by mouth daily. Takes 1.25 mg on Tues,Thurs, Sat.  Takes 2.5 mg all other days.        Please HPI for pertinent positives, otherwise complete 10 system ROS negative.  Physical Exam: BP 130/90  Pulse 57  Temp(Src) 97.6 F (36.4 C) (Oral)  Resp 16  Ht 5\' 5"  (1.651 m)  Wt 150 lb (68.04 kg)  BMI 24.96 kg/m2  SpO2 100% Body mass index is 24.96 kg/(m^2).   General Appearance:  Alert, cooperative, no distress, appears stated age  Head:  Normocephalic, without obvious abnormality, atraumatic  ENT: Unremarkable  Neck: Supple, symmetrical, trachea midline  Lungs:  Clear to auscultation bilaterally, no w/r/r, respirations unlabored without use of accessory muscles.  Chest Wall:  No tenderness or deformity  Heart:  Regular rate and rhythm, S1, S2 normal, no murmur, rub or gallop.  Abdomen:   Soft, non-tender, non distended.  Extremities: Extremities normal, atraumatic, no cyanosis or edema  Pulses: 2+ and symmetric femoral and pedal pulses  Neurologic: Normal affect, no gross deficits.   Results for orders placed during the hospital encounter of 04/18/13 (from the past 48 hour(s))  APTT     Status: None   Collection Time    04/18/13  7:10 AM      Result Value Range   aPTT 28  24 - 37 seconds  CBC     Status: Abnormal   Collection Time    04/18/13  7:10 AM      Result Value Range   WBC 8.2  4.0 - 10.5 K/uL   RBC 3.94 (*)  4.22 - 5.81 MIL/uL   Hemoglobin 13.5  13.0 - 17.0 g/dL   HCT 40.9  81.1 - 91.4 %   MCV 100.8 (*) 78.0 - 100.0 fL   MCH 34.3 (*) 26.0 - 34.0 pg   MCHC 34.0  30.0 - 36.0 g/dL   RDW 78.2  95.6 - 21.3 %   Platelets 188  150 - 400 K/uL  COMPREHENSIVE METABOLIC PANEL     Status: Abnormal   Collection Time    04/18/13  7:10 AM      Result Value Range   Sodium 139  135 - 145 mEq/L   Potassium 4.0  3.5 - 5.1 mEq/L   Chloride 105  96 - 112 mEq/L   CO2 25  19 - 32 mEq/L   Glucose, Bld 122 (*) 70 - 99 mg/dL   BUN 19  6 - 23 mg/dL   Creatinine, Ser 0.86  0.50 - 1.35 mg/dL   Calcium 9.4  8.4 - 57.8 mg/dL   Total Protein 8.9 (*) 6.0 - 8.3 g/dL   Albumin 3.8  3.5 - 5.2 g/dL   AST 89 (*) 0 - 37 U/L   ALT 119 (*) 0 - 53 U/L   Alkaline Phosphatase 108  39 - 117 U/L   Total Bilirubin 0.6  0.3 - 1.2 mg/dL   GFR calc non Af Amer 83 (*) >90 mL/min   GFR calc Af Amer >90  >90 mL/min   Comment:            The eGFR has been calculated     using the CKD EPI equation.     This calculation has not been     validated in all clinical     situations.     eGFR's persistently     <90 mL/min signify     possible Chronic Kidney Disease.  PROTIME-INR     Status: None   Collection Time    04/18/13  7:10 AM      Result Value Range   Prothrombin Time 13.8  11.6 - 15.2 seconds   INR 1.08  0.00 - 1.49   No results found.  Assessment/Plan HCC of left lobe For pre Y-90 angiogram today, with possible Celiac stent angio and possible coil embo of right gastric and GDA, followed by test dose of Y-90. Reviewed procedure in detail, risks, complications, use of sedation. Labs reviewed, ok Consent signed in chart  Brayton El PA-C 04/18/2013, 8:38 AM

## 2013-04-18 NOTE — Procedures (Signed)
Post pre Y-90 radioembolization.  No immediate complications.  Keep right leg straight for 4 hrs.

## 2013-04-24 ENCOUNTER — Other Ambulatory Visit: Payer: Self-pay | Admitting: Radiology

## 2013-04-24 ENCOUNTER — Other Ambulatory Visit (HOSPITAL_COMMUNITY): Payer: Self-pay | Admitting: Interventional Radiology

## 2013-04-24 ENCOUNTER — Ambulatory Visit (HOSPITAL_COMMUNITY)
Admission: RE | Admit: 2013-04-24 | Discharge: 2013-04-24 | Disposition: A | Discharge status: Home / Self Care | Payer: Medicare HMO | Source: Ambulatory Visit | Attending: Interventional Radiology | Admitting: Interventional Radiology

## 2013-04-24 DIAGNOSIS — C220 Liver cell carcinoma: Secondary | ICD-10-CM

## 2013-04-24 DIAGNOSIS — C228 Malignant neoplasm of liver, primary, unspecified as to type: Secondary | ICD-10-CM | POA: Insufficient documentation

## 2013-04-25 LAB — CBC WITH DIFFERENTIAL/PLATELET
Basophils Absolute: 0 10*3/uL (ref 0.0–0.1)
Basophils Relative: 0 % (ref 0–1)
Eosinophils Absolute: 0.4 10*3/uL (ref 0.0–0.7)
Eosinophils Relative: 5 % (ref 0–5)
HCT: 39.2 % (ref 39.0–52.0)
MCHC: 32.7 g/dL (ref 30.0–36.0)
MCV: 102.6 fL — ABNORMAL HIGH (ref 78.0–100.0)
Monocytes Absolute: 1.1 10*3/uL — ABNORMAL HIGH (ref 0.1–1.0)
RDW: 14 % (ref 11.5–15.5)

## 2013-04-25 LAB — COMPREHENSIVE METABOLIC PANEL
AST: 70 U/L — ABNORMAL HIGH (ref 0–37)
Albumin: 3.8 g/dL (ref 3.5–5.2)
Calcium: 9.4 mg/dL (ref 8.4–10.5)
Creatinine, Ser: 0.92 mg/dL (ref 0.50–1.35)
GFR calc non Af Amer: 85 mL/min — ABNORMAL LOW (ref >90)

## 2013-05-04 ENCOUNTER — Other Ambulatory Visit: Payer: Self-pay | Admitting: Radiology

## 2013-05-08 ENCOUNTER — Other Ambulatory Visit: Payer: Self-pay

## 2013-05-08 ENCOUNTER — Ambulatory Visit (HOSPITAL_COMMUNITY)
Admission: RE | Admit: 2013-05-08 | Discharge: 2013-05-08 | Disposition: A | Discharge status: Home / Self Care | Payer: Medicare HMO | Source: Ambulatory Visit | Attending: Interventional Radiology | Admitting: Interventional Radiology

## 2013-05-08 ENCOUNTER — Ambulatory Visit (HOSPITAL_COMMUNITY)
Admission: RE | Admit: 2013-05-08 | Discharge: 2013-05-08 | Disposition: A | Discharge status: Home / Self Care | Payer: Medicare HMO | Source: Ambulatory Visit | Attending: Diagnostic Radiology | Admitting: Diagnostic Radiology

## 2013-05-08 ENCOUNTER — Encounter (HOSPITAL_COMMUNITY)
Admission: RE | Admit: 2013-05-08 | Discharge: 2013-05-08 | Disposition: A | Discharge status: Home / Self Care | Payer: Medicare HMO | Source: Ambulatory Visit | Attending: Interventional Radiology | Admitting: Interventional Radiology

## 2013-05-08 ENCOUNTER — Encounter (HOSPITAL_COMMUNITY): Payer: Self-pay

## 2013-05-08 ENCOUNTER — Other Ambulatory Visit: Payer: Self-pay | Admitting: Interventional Radiology

## 2013-05-08 ENCOUNTER — Other Ambulatory Visit (HOSPITAL_COMMUNITY): Payer: Self-pay | Admitting: Interventional Radiology

## 2013-05-08 DIAGNOSIS — C220 Liver cell carcinoma: Secondary | ICD-10-CM

## 2013-05-08 DIAGNOSIS — K219 Gastro-esophageal reflux disease without esophagitis: Secondary | ICD-10-CM | POA: Insufficient documentation

## 2013-05-08 DIAGNOSIS — Z954 Presence of other heart-valve replacement: Secondary | ICD-10-CM | POA: Insufficient documentation

## 2013-05-08 DIAGNOSIS — I714 Abdominal aortic aneurysm, without rupture, unspecified: Secondary | ICD-10-CM | POA: Insufficient documentation

## 2013-05-08 DIAGNOSIS — I4891 Unspecified atrial fibrillation: Secondary | ICD-10-CM | POA: Insufficient documentation

## 2013-05-08 DIAGNOSIS — B1920 Unspecified viral hepatitis C without hepatic coma: Secondary | ICD-10-CM | POA: Insufficient documentation

## 2013-05-08 DIAGNOSIS — I080 Rheumatic disorders of both mitral and aortic valves: Secondary | ICD-10-CM | POA: Insufficient documentation

## 2013-05-08 DIAGNOSIS — Z7901 Long term (current) use of anticoagulants: Secondary | ICD-10-CM | POA: Insufficient documentation

## 2013-05-08 DIAGNOSIS — C228 Malignant neoplasm of liver, primary, unspecified as to type: Secondary | ICD-10-CM | POA: Insufficient documentation

## 2013-05-08 DIAGNOSIS — I10 Essential (primary) hypertension: Secondary | ICD-10-CM | POA: Insufficient documentation

## 2013-05-08 DIAGNOSIS — N400 Enlarged prostate without lower urinary tract symptoms: Secondary | ICD-10-CM | POA: Insufficient documentation

## 2013-05-08 LAB — CBC
HCT: 41.1 % (ref 39.0–52.0)
MCHC: 33.8 g/dL (ref 30.0–36.0)
MCV: 100.7 fL — ABNORMAL HIGH (ref 78.0–100.0)
RDW: 13.8 % (ref 11.5–15.5)

## 2013-05-08 LAB — COMPREHENSIVE METABOLIC PANEL
Albumin: 3.3 g/dL — ABNORMAL LOW (ref 3.5–5.2)
BUN: 21 mg/dL (ref 6–23)
Chloride: 104 mEq/L (ref 96–112)
Creatinine, Ser: 1.03 mg/dL (ref 0.50–1.35)
Total Bilirubin: 0.8 mg/dL (ref 0.3–1.2)
Total Protein: 8.4 g/dL — ABNORMAL HIGH (ref 6.0–8.3)

## 2013-05-08 LAB — PROTIME-INR
INR: 1.21 (ref 0.00–1.49)
Prothrombin Time: 15 seconds (ref 11.6–15.2)

## 2013-05-08 MED ORDER — PANTOPRAZOLE SODIUM 40 MG IV SOLR
INTRAVENOUS | Status: AC
  Filled 2013-05-08: qty 40

## 2013-05-08 MED ORDER — FENTANYL CITRATE 0.05 MG/ML IJ SOLN
INTRAMUSCULAR | Status: AC
  Filled 2013-05-08: qty 8

## 2013-05-08 MED ORDER — SODIUM CHLORIDE 0.9 % IV SOLN
INTRAVENOUS | Status: DC
  Administered 2013-05-08: 20 mL/h via INTRAVENOUS

## 2013-05-08 MED ORDER — IOHEXOL 300 MG/ML  SOLN
80.0000 mL | Freq: Once | INTRAMUSCULAR | Status: AC | PRN
  Administered 2013-05-08: 85 mL via INTRA_ARTERIAL

## 2013-05-08 MED ORDER — PANTOPRAZOLE SODIUM 40 MG IV SOLR
40.0000 mg | Freq: Once | INTRAVENOUS | Status: AC
  Administered 2013-05-08: 40 mg via INTRAVENOUS

## 2013-05-08 MED ORDER — FENTANYL CITRATE 0.05 MG/ML IJ SOLN
INTRAMUSCULAR | Status: AC | PRN
  Administered 2013-05-08 (×2): 100 ug via INTRAVENOUS
  Administered 2013-05-08 (×2): 50 ug via INTRAVENOUS

## 2013-05-08 MED ORDER — SUCRALFATE 1 GM/10ML PO SUSP
1.0000 g | Freq: Three times a day (TID) | ORAL | Status: DC
  Administered 2013-05-08: 1 g via ORAL
  Filled 2013-05-08: qty 10

## 2013-05-08 MED ORDER — MIDAZOLAM HCL 2 MG/2ML IJ SOLN
INTRAMUSCULAR | Status: AC | PRN
  Administered 2013-05-08 (×2): 2 mg via INTRAVENOUS

## 2013-05-08 MED ORDER — ONDANSETRON HCL 4 MG/2ML IJ SOLN
4.0000 mg | Freq: Once | INTRAMUSCULAR | Status: AC
  Administered 2013-05-08: 4 mg via INTRAVENOUS
  Filled 2013-05-08: qty 2

## 2013-05-08 MED ORDER — KETOROLAC TROMETHAMINE 30 MG/ML IJ SOLN
30.0000 mg | Freq: Once | INTRAMUSCULAR | Status: AC
  Administered 2013-05-08: 30 mg via INTRAVENOUS
  Filled 2013-05-08: qty 1

## 2013-05-08 MED ORDER — PIPERACILLIN SOD-TAZOBACTAM SO 2.25 (2-0.25) G IV SOLR
3.3750 g | Freq: Once | INTRAVENOUS | Status: AC
  Administered 2013-05-08: 3.375 g via INTRAVENOUS
  Filled 2013-05-08: qty 3.38

## 2013-05-08 MED ORDER — DEXAMETHASONE SODIUM PHOSPHATE 10 MG/ML IJ SOLN
20.0000 mg | Freq: Once | INTRAMUSCULAR | Status: AC
  Administered 2013-05-08: 20 mg via INTRAVENOUS
  Filled 2013-05-08: qty 2

## 2013-05-08 MED ORDER — MIDAZOLAM HCL 2 MG/2ML IJ SOLN
INTRAMUSCULAR | Status: AC
  Filled 2013-05-08: qty 8

## 2013-05-08 MED ORDER — YTTRIUM 90 INJECTION: 27.7900 | INJECTION | Freq: Once | INTRAVENOUS | Status: DC

## 2013-05-08 NOTE — H&P (Signed)
Glen Pitts is an 67 y.o. male.   Chief Complaint: liver cancer HPI: Patient with history of left lobe HCC presents today for Y90 radioembolization.  Past Medical History  Diagnosis Date  . Hypertension   . Benign prostatic hypertrophy   . History of esophageal reflux   . Mitral and aortic regurgitation   . Atrial fibrillation   . AAA (abdominal aortic aneurysm)   . Acute renal failure   . Acute systolic congestive heart failure   . Cheyne-Stokes respiration   . Cardiomyopathy   . Erectile dysfunction   . GERD (gastroesophageal reflux disease)   . Hematuria   . Hepatitis C virus   . Hepatocellular carcinoma   . Mitral valve disorder   . Obstructive sleep apnea   . Pleural effusion   . Dyspnea   . Benign hypertension   . Anticoagulant long-term use   . Rectal bleeding     Past Surgical History  Procedure Laterality Date  . Open heart surgery  2012  . Aortic valve replacement      History reviewed. No pertinent family history. Social History:  reports that he has quit smoking. His smoking use included Cigarettes. He smoked 0.00 packs per day for 0 years. He does not have any smokeless tobacco history on file. He reports that  drinks alcohol. He reports that he does not use illicit drugs.  Allergies: No Known Allergies  Current outpatient prescriptions:amitriptyline (ELAVIL) 25 MG tablet, Take 25 mg by mouth at bedtime., Disp: , Rfl: ;  bimatoprost (LUMIGAN) 0.03 % ophthalmic solution, Place 1 drop into both eyes at bedtime., Disp: , Rfl: ;  dorzolamide-timolol (COSOPT) 22.3-6.8 MG/ML ophthalmic solution, Place 1 drop into both eyes 2 (two) times daily., Disp: , Rfl: ;  furosemide (LASIX) 20 MG tablet, Take 20 mg by mouth 2 (two) times daily., Disp: , Rfl:  lisinopril (PRINIVIL,ZESTRIL) 5 MG tablet, Take 5 mg by mouth daily., Disp: , Rfl: ;  metoprolol succinate (TOPROL-XL) 25 MG 24 hr tablet, Take 50 mg by mouth daily., Disp: , Rfl: ;  warfarin (COUMADIN) 2.5 MG tablet, Take  1.25-2.5 mg by mouth daily. Takes 1.25 mg on Tues,Thurs, Sat.  Takes 2.5 mg all other days., Disp: , Rfl:  Current facility-administered medications:0.9 %  sodium chloride infusion, , Intravenous, Continuous, Brayton El, PA-C, Last Rate: 20 mL/hr at 05/08/13 0702, 20 mL/hr at 05/08/13 0702;  pantoprazole (PROTONIX) injection 40 mg, 40 mg, Intravenous, Once, [employer], PA-C;  piperacillin-tazobactam (ZOSYN) 3.375 g in dextrose 5 % 50 mL IVPB, 3.375 g, Intravenous, Once, Brayton El, PA-C  Results for orders placed during the hospital encounter of 05/08/13  CBC      Result Value Range   WBC 9.9  4.0 - 10.5 K/uL   RBC 4.08 (*) 4.22 - 5.81 MIL/uL   Hemoglobin 13.9  13.0 - 17.0 g/dL   HCT 16.1  09.6 - 04.5 %   MCV 100.7 (*) 78.0 - 100.0 fL   MCH 34.1 (*) 26.0 - 34.0 pg   MCHC 33.8  30.0 - 36.0 g/dL   RDW 40.9  81.1 - 91.4 %   Platelets 205  150 - 400 K/uL  COMPREHENSIVE METABOLIC PANEL      Result Value Range   Sodium 137  135 - 145 mEq/L   Potassium 4.0  3.5 - 5.1 mEq/L   Chloride 104  96 - 112 mEq/L   CO2 22  19 - 32 mEq/L   Glucose, Bld 115 (*) 70 -  99 mg/dL   BUN 21  6 - 23 mg/dL   Creatinine, Ser 1.61  0.50 - 1.35 mg/dL   Calcium 9.1  8.4 - 09.6 mg/dL   Total Protein 8.4 (*) 6.0 - 8.3 g/dL   Albumin 3.3 (*) 3.5 - 5.2 g/dL   AST 75 (*) 0 - 37 U/L   ALT 75 (*) 0 - 53 U/L   Alkaline Phosphatase 98  39 - 117 U/L   Total Bilirubin 0.8  0.3 - 1.2 mg/dL   GFR calc non Af Amer 73 (*) >90 mL/min   GFR calc Af Amer 84 (*) >90 mL/min  PROTIME-INR      Result Value Range   Prothrombin Time 15.0  11.6 - 15.2 seconds   INR 1.21  0.00 - 1.49  APTT      Result Value Range   aPTT 32  24 - 37 seconds      EKG- a flutter with variable AV block Review of Systems  Constitutional: Negative for fever and chills.  Respiratory: Negative for cough and shortness of breath.   Cardiovascular: Negative for chest pain.  Gastrointestinal: Negative for nausea, vomiting and abdominal pain.   Musculoskeletal: Positive for back pain.  Neurological: Negative for headaches.  Endo/Heme/Allergies: Does not bruise/bleed easily.    Blood pressure 113/80, pulse 55, temperature 97.3 F (36.3 C), temperature source Oral, resp. rate 20, height 5\' 5"  (1.651 m), weight 155 lb (70.308 kg), SpO2 98.00%. Physical Exam  Constitutional: He is oriented to person, place, and time. He appears well-developed and well-nourished.  Cardiovascular:  irreg irreg  Respiratory: Effort normal.  Sl dim BS bases  GI: Soft. Bowel sounds are normal. There is no tenderness.  Musculoskeletal: Normal range of motion. He exhibits no edema.  Neurological: He is alert and oriented to person, place, and time.     Assessment/Plan Pt with hx left lobe HCC. Plan is for Y90 radioembolization today. Details/risks of procedure d/w pt/son via interpreter with their understanding and consent.  ALLRED,D KEVIN 05/08/2013, 8:10 AM

## 2013-05-08 NOTE — Procedures (Signed)
Post coil embolization of the right gastric artery and Y-90 lobectomy of the left lobe of the liver for multifocal HCC.  Access via the right common femoral artery.  No immediate complications.  Keep right leg straight for 3 hrs.

## 2013-05-24 ENCOUNTER — Other Ambulatory Visit: Payer: Self-pay | Admitting: Emergency Medicine

## 2013-05-24 ENCOUNTER — Other Ambulatory Visit (HOSPITAL_COMMUNITY): Payer: Self-pay | Admitting: Interventional Radiology

## 2013-05-24 DIAGNOSIS — C220 Liver cell carcinoma: Secondary | ICD-10-CM

## 2013-06-04 LAB — COMPLETE METABOLIC PANEL WITH GFR
ALT: 157 U/L — ABNORMAL HIGH (ref 0–53)
CO2: 26 mEq/L (ref 19–32)
Creat: 1.03 mg/dL (ref 0.50–1.35)
GFR, Est African American: 86 mL/min
GFR, Est Non African American: 74 mL/min
Total Bilirubin: 0.8 mg/dL (ref 0.3–1.2)

## 2013-06-13 ENCOUNTER — Other Ambulatory Visit: Payer: Medicare HMO

## 2013-06-26 ENCOUNTER — Ambulatory Visit
Admission: RE | Admit: 2013-06-26 | Discharge: 2013-06-26 | Disposition: A | Discharge status: Home / Self Care | Payer: Medicare HMO | Source: Ambulatory Visit | Attending: Interventional Radiology | Admitting: Interventional Radiology

## 2013-06-26 DIAGNOSIS — C220 Liver cell carcinoma: Secondary | ICD-10-CM

## 2013-06-26 NOTE — Progress Notes (Signed)
Patient's son and interpretor present.  Appetite:  Poor-fair.  Denies nausea, vomiting or diarrhea.   Denies abdominal discomfort or bloating.    Endurance:  Fatigues easily.  Sleeping:  Poor-fair.  Jie Stickels Carmell Austria, RN 06/26/2013 8:44 AM

## 2013-07-26 ENCOUNTER — Other Ambulatory Visit: Payer: Self-pay | Admitting: Emergency Medicine

## 2013-07-26 DIAGNOSIS — C220 Liver cell carcinoma: Secondary | ICD-10-CM

## 2013-08-07 ENCOUNTER — Telehealth: Payer: Self-pay | Admitting: Emergency Medicine

## 2013-08-07 NOTE — Telephone Encounter (Signed)
LM FOR Glen Pitts (SON) TO SET PT UP FOR MRI AND BLOOD WORK IN [place].   CALLED Glen Pitts AT 5:08PM- MADE APPT AT [facility] FOR MRI FOR 08-10-13 AT 400PM- I AM MAILING HIS LAB ORDERS TO HIM IN  THE MAIL TO HAVE DONE IN [place].  WE WILL CALL BACK WHEN WE HAVE ALL RESULTS.

## 2013-08-10 ENCOUNTER — Other Ambulatory Visit (HOSPITAL_COMMUNITY): Payer: Self-pay | Admitting: Interventional Radiology

## 2013-08-10 LAB — HEPATIC FUNCTION PANEL
ALT: 40 U/L (ref 0–53)
Bilirubin, Direct: 0.2 mg/dL (ref 0.0–0.3)
Indirect Bilirubin: 0.6 mg/dL (ref 0.0–0.9)
Total Bilirubin: 0.8 mg/dL (ref 0.3–1.2)

## 2014-01-21 ENCOUNTER — Other Ambulatory Visit (HOSPITAL_COMMUNITY): Payer: Self-pay | Admitting: Interventional Radiology

## 2014-01-21 DIAGNOSIS — C220 Liver cell carcinoma: Secondary | ICD-10-CM

## 2014-01-22 ENCOUNTER — Ambulatory Visit
Admission: RE | Admit: 2014-01-22 | Discharge: 2014-01-22 | Disposition: A | Discharge status: Home / Self Care | Payer: Medicare HMO | Source: Ambulatory Visit | Attending: Interventional Radiology | Admitting: Interventional Radiology

## 2014-01-22 DIAGNOSIS — C220 Liver cell carcinoma: Secondary | ICD-10-CM

## 2014-01-23 ENCOUNTER — Other Ambulatory Visit: Payer: Self-pay | Admitting: Internal Medicine

## 2014-01-23 ENCOUNTER — Other Ambulatory Visit: Payer: Self-pay | Admitting: Interventional Radiology

## 2014-01-23 DIAGNOSIS — C220 Liver cell carcinoma: Secondary | ICD-10-CM

## 2014-01-24 ENCOUNTER — Other Ambulatory Visit: Payer: Self-pay | Admitting: Internal Medicine

## 2014-01-24 ENCOUNTER — Other Ambulatory Visit: Payer: Self-pay | Admitting: Interventional Radiology

## 2014-01-24 DIAGNOSIS — C220 Liver cell carcinoma: Secondary | ICD-10-CM

## 2014-02-06 ENCOUNTER — Other Ambulatory Visit (HOSPITAL_COMMUNITY): Payer: Medicare HMO

## 2014-02-12 ENCOUNTER — Other Ambulatory Visit: Payer: Self-pay | Admitting: Internal Medicine

## 2014-02-25 ENCOUNTER — Other Ambulatory Visit: Payer: Self-pay | Admitting: Radiology

## 2014-02-26 ENCOUNTER — Encounter (HOSPITAL_COMMUNITY): Payer: Self-pay

## 2014-02-26 ENCOUNTER — Ambulatory Visit (HOSPITAL_COMMUNITY)
Admission: RE | Admit: 2014-02-26 | Discharge: 2014-02-26 | Disposition: A | Discharge status: Home / Self Care | Payer: Medicare HMO | Source: Ambulatory Visit | Attending: Interventional Radiology | Admitting: Interventional Radiology

## 2014-02-26 ENCOUNTER — Encounter (HOSPITAL_COMMUNITY)
Admission: RE | Admit: 2014-02-26 | Discharge: 2014-02-26 | Disposition: A | Discharge status: Home / Self Care | Payer: Medicare HMO | Source: Ambulatory Visit | Attending: Interventional Radiology | Admitting: Interventional Radiology

## 2014-02-26 ENCOUNTER — Other Ambulatory Visit: Payer: Self-pay | Admitting: Interventional Radiology

## 2014-02-26 DIAGNOSIS — Z954 Presence of other heart-valve replacement: Secondary | ICD-10-CM | POA: Insufficient documentation

## 2014-02-26 DIAGNOSIS — I4891 Unspecified atrial fibrillation: Secondary | ICD-10-CM | POA: Insufficient documentation

## 2014-02-26 DIAGNOSIS — G4733 Obstructive sleep apnea (adult) (pediatric): Secondary | ICD-10-CM | POA: Diagnosis not present

## 2014-02-26 DIAGNOSIS — Z79899 Other long term (current) drug therapy: Secondary | ICD-10-CM | POA: Insufficient documentation

## 2014-02-26 DIAGNOSIS — I2510 Atherosclerotic heart disease of native coronary artery without angina pectoris: Secondary | ICD-10-CM | POA: Diagnosis not present

## 2014-02-26 DIAGNOSIS — I509 Heart failure, unspecified: Secondary | ICD-10-CM | POA: Diagnosis not present

## 2014-02-26 DIAGNOSIS — I10 Essential (primary) hypertension: Secondary | ICD-10-CM | POA: Insufficient documentation

## 2014-02-26 DIAGNOSIS — B182 Chronic viral hepatitis C: Secondary | ICD-10-CM | POA: Insufficient documentation

## 2014-02-26 DIAGNOSIS — C228 Malignant neoplasm of liver, primary, unspecified as to type: Secondary | ICD-10-CM | POA: Diagnosis present

## 2014-02-26 DIAGNOSIS — K219 Gastro-esophageal reflux disease without esophagitis: Secondary | ICD-10-CM | POA: Insufficient documentation

## 2014-02-26 DIAGNOSIS — N400 Enlarged prostate without lower urinary tract symptoms: Secondary | ICD-10-CM | POA: Insufficient documentation

## 2014-02-26 DIAGNOSIS — C220 Liver cell carcinoma: Secondary | ICD-10-CM

## 2014-02-26 DIAGNOSIS — I428 Other cardiomyopathies: Secondary | ICD-10-CM | POA: Diagnosis not present

## 2014-02-26 DIAGNOSIS — I059 Rheumatic mitral valve disease, unspecified: Secondary | ICD-10-CM | POA: Insufficient documentation

## 2014-02-26 LAB — COMPREHENSIVE METABOLIC PANEL
ALT: 89 U/L — ABNORMAL HIGH (ref 0–53)
AST: 69 U/L — ABNORMAL HIGH (ref 0–37)
Albumin: 3.8 g/dL (ref 3.5–5.2)
Alkaline Phosphatase: 110 U/L (ref 39–117)
BUN: 17 mg/dL (ref 6–23)
CALCIUM: 9.4 mg/dL (ref 8.4–10.5)
CO2: 23 mEq/L (ref 19–32)
CREATININE: 0.92 mg/dL (ref 0.50–1.35)
Chloride: 105 mEq/L (ref 96–112)
GFR calc non Af Amer: 84 mL/min — ABNORMAL LOW (ref >90)
GLUCOSE: 114 mg/dL — AB (ref 70–99)
Potassium: 4.3 mEq/L (ref 3.7–5.3)
Sodium: 138 mEq/L (ref 137–147)
TOTAL PROTEIN: 9.1 g/dL — AB (ref 6.0–8.3)
Total Bilirubin: 0.7 mg/dL (ref 0.3–1.2)

## 2014-02-26 LAB — CBC WITH DIFFERENTIAL/PLATELET
Basophils Absolute: 0 10*3/uL (ref 0.0–0.1)
Basophils Relative: 1 % (ref 0–1)
EOS ABS: 0.3 10*3/uL (ref 0.0–0.7)
EOS PCT: 5 % (ref 0–5)
HEMATOCRIT: 44.1 % (ref 39.0–52.0)
Hemoglobin: 15.3 g/dL (ref 13.0–17.0)
LYMPHS ABS: 2.2 10*3/uL (ref 0.7–4.0)
Lymphocytes Relative: 33 % (ref 12–46)
MCH: 35.7 pg — AB (ref 26.0–34.0)
MCHC: 34.7 g/dL (ref 30.0–36.0)
MCV: 103 fL — AB (ref 78.0–100.0)
MONO ABS: 1 10*3/uL (ref 0.1–1.0)
MONOS PCT: 14 % — AB (ref 3–12)
Neutro Abs: 3.1 10*3/uL (ref 1.7–7.7)
Neutrophils Relative %: 47 % (ref 43–77)
Platelets: 122 10*3/uL — ABNORMAL LOW (ref 150–400)
RBC: 4.28 MIL/uL (ref 4.22–5.81)
RDW: 13.6 % (ref 11.5–15.5)
WBC: 6.6 10*3/uL (ref 4.0–10.5)

## 2014-02-26 LAB — APTT: aPTT: 29 seconds (ref 24–37)

## 2014-02-26 LAB — PROTIME-INR
INR: 1.03 (ref 0.00–1.49)
Prothrombin Time: 13.3 seconds (ref 11.6–15.2)

## 2014-02-26 MED ORDER — TECHNETIUM TO 99M ALBUMIN AGGREGATED
4.7000 | Freq: Once | INTRAVENOUS | Status: AC | PRN
  Administered 2014-02-26: 5 via INTRAVENOUS

## 2014-02-26 MED ORDER — IOHEXOL 300 MG/ML  SOLN: 150.0000 mL | Freq: Once | INTRAMUSCULAR | Status: DC | PRN

## 2014-02-26 MED ORDER — MIDAZOLAM HCL 2 MG/2ML IJ SOLN
INTRAMUSCULAR | Status: DC | PRN
Start: 2014-02-26 — End: 2014-02-27
  Administered 2014-02-26 (×4): 0.5 mg via INTRAVENOUS

## 2014-02-26 MED ORDER — FENTANYL CITRATE 0.05 MG/ML IJ SOLN
INTRAMUSCULAR | Status: DC | PRN
  Administered 2014-02-26 (×2): 25 ug via INTRAVENOUS

## 2014-02-26 MED ORDER — MIDAZOLAM HCL 2 MG/2ML IJ SOLN
INTRAMUSCULAR | Status: AC
  Filled 2014-02-26: qty 6

## 2014-02-26 MED ORDER — SODIUM CHLORIDE 0.9 % IV SOLN
INTRAVENOUS | Status: DC
  Administered 2014-02-26: 08:00:00 via INTRAVENOUS

## 2014-02-26 MED ORDER — FENTANYL CITRATE 0.05 MG/ML IJ SOLN
INTRAMUSCULAR | Status: AC
  Filled 2014-02-26: qty 6

## 2014-02-26 NOTE — H&P (Signed)
Glen Pitts is an 68 y.o. male.   Chief Complaint: hepatocellular carcinoma HPI: 68 year old [place], non-English speaking  male patient well known to the Interventional Radiology service with  complex past medical history most significant for extensive  cardiovascular disease and chronic Hepatitis C virus who underwent  successful radiation lobectomy of the left lobe of the liver for  biopsy-proven hepatocellular carcinoma. Continued surveillance  imaging unfortunately demonstrates interval development of new  multifocal hepatocellular carcinoma within the remainder of the  right lobe of the liver. He presents today for repeat mapping arteriogram of right lobe of liver and test MAA dose prior to planned Y-90 radioembolization.   Past Medical History  Diagnosis Date  . Hypertension   . Benign prostatic hypertrophy   . History of esophageal reflux   . Mitral and aortic regurgitation   . Atrial fibrillation   . AAA (abdominal aortic aneurysm)   . Acute renal failure   . Acute systolic congestive heart failure   . Cheyne-Stokes respiration   . Cardiomyopathy   . Erectile dysfunction   . GERD (gastroesophageal reflux disease)   . Hematuria   . Hepatitis C virus   . Hepatocellular carcinoma   . Mitral valve disorder   . Obstructive sleep apnea   . Pleural effusion   . Dyspnea   . Benign hypertension   . Anticoagulant long-term use   . Rectal bleeding     Past Surgical History  Procedure Laterality Date  . Open heart surgery  2012  . Aortic valve replacement      History reviewed. No pertinent family history. Social History:  reports that he has quit smoking. His smoking use included Cigarettes. He smoked 0.00 packs per day for 0 years. He does not have any smokeless tobacco history on file. He reports that he drinks alcohol. He reports that he does not use illicit drugs.  Allergies: No Known Allergies  Current outpatient prescriptions:bimatoprost (LUMIGAN) 0.03 %  ophthalmic solution, Place 1 drop into both eyes at bedtime., Disp: , Rfl: ;  dorzolamide-timolol (COSOPT) 22.3-6.8 MG/ML ophthalmic solution, Place 1 drop into both eyes 2 (two) times daily., Disp: , Rfl: ;  furosemide (LASIX) 20 MG tablet, Take 20 mg by mouth 2 (two) times daily., Disp: , Rfl: ;  lisinopril (PRINIVIL,ZESTRIL) 5 MG tablet, Take 5 mg by mouth daily., Disp: , Rfl:  metoprolol succinate (TOPROL-XL) 25 MG 24 hr tablet, Take 50 mg by mouth daily., Disp: , Rfl: ;  spironolactone (ALDACTONE) 25 MG tablet, Take 12.5 mg by mouth daily., Disp: , Rfl: ;  warfarin (COUMADIN) 2.5 MG tablet, Take 1.25-2.5 mg by mouth daily. Takes 1.25 mg on Tues,Thurs, Sat.  Takes 2.5 mg all other days., Disp: , Rfl:  Current facility-administered medications:0.9 %  sodium chloride infusion, , Intravenous, Continuous, Hedy Jacob, PA-C, Last Rate: 50 mL/hr at 02/26/14 0745   Results for orders placed during the hospital encounter of 02/26/14 (from the past 48 hour(s))  APTT     Status: None   Collection Time    02/26/14  7:45 AM      Result Value Ref Range   aPTT 29  24 - 37 seconds  CBC WITH DIFFERENTIAL     Status: Abnormal   Collection Time    02/26/14  7:45 AM      Result Value Ref Range   WBC 6.6  4.0 - 10.5 K/uL   RBC 4.28  4.22 - 5.81 MIL/uL   Hemoglobin 15.3  13.0 - 17.0  g/dL   HCT 44.1  39.0 - 52.0 %   MCV 103.0 (*) 78.0 - 100.0 fL   MCH 35.7 (*) 26.0 - 34.0 pg   MCHC 34.7  30.0 - 36.0 g/dL   RDW 13.6  11.5 - 15.5 %   Platelets 122 (*) 150 - 400 K/uL   Neutrophils Relative % 47  43 - 77 %   Neutro Abs 3.1  1.7 - 7.7 K/uL   Lymphocytes Relative 33  12 - 46 %   Lymphs Abs 2.2  0.7 - 4.0 K/uL   Monocytes Relative 14 (*) 3 - 12 %   Monocytes Absolute 1.0  0.1 - 1.0 K/uL   Eosinophils Relative 5  0 - 5 %   Eosinophils Absolute 0.3  0.0 - 0.7 K/uL   Basophils Relative 1  0 - 1 %   Basophils Absolute 0.0  0.0 - 0.1 K/uL  COMPREHENSIVE METABOLIC PANEL     Status: Abnormal   Collection Time     02/26/14  7:45 AM      Result Value Ref Range   Sodium 138  137 - 147 mEq/L   Potassium 4.3  3.7 - 5.3 mEq/L   Chloride 105  96 - 112 mEq/L   CO2 23  19 - 32 mEq/L   Glucose, Bld 114 (*) 70 - 99 mg/dL   BUN 17  6 - 23 mg/dL   Creatinine, Ser 0.92  0.50 - 1.35 mg/dL   Calcium 9.4  8.4 - 10.5 mg/dL   Total Protein 9.1 (*) 6.0 - 8.3 g/dL   Albumin 3.8  3.5 - 5.2 g/dL   AST 69 (*) 0 - 37 U/L   ALT 89 (*) 0 - 53 U/L   Alkaline Phosphatase 110  39 - 117 U/L   Total Bilirubin 0.7  0.3 - 1.2 mg/dL   GFR calc non Af Amer 84 (*) >90 mL/min   GFR calc Af Amer >90  >90 mL/min   Comment: (NOTE)     The eGFR has been calculated using the CKD EPI equation.     This calculation has not been validated in all clinical situations.     eGFR's persistently <90 mL/min signify possible Chronic Kidney     Disease.  PROTIME-INR     Status: None   Collection Time    02/26/14  7:45 AM      Result Value Ref Range   Prothrombin Time 13.3  11.6 - 15.2 seconds   INR 1.03  0.00 - 1.49   No results found.  Review of Systems  Constitutional: Positive for malaise/fatigue. Negative for fever and chills.  HENT:       Occ HA's  Respiratory: Negative for cough, hemoptysis and shortness of breath.   Cardiovascular: Negative for chest pain.  Gastrointestinal: Negative for nausea, vomiting and blood in stool.       Occ mild epigastric discomfort  Genitourinary: Negative for hematuria.  Musculoskeletal:       Occ back pain  Endo/Heme/Allergies: Does not bruise/bleed easily.    Blood pressure 119/91, pulse 60, temperature 97.6 F (36.4 C), temperature source Oral, resp. rate 16, SpO2 98.00%. Physical Exam  Constitutional: He is oriented to person, place, and time. He appears well-developed and well-nourished.  Cardiovascular:  irreg irreg  Respiratory: Effort normal and breath sounds normal.  GI: Bowel sounds are normal. There is no tenderness.  Musculoskeletal: Normal range of motion. He exhibits no  edema.  Neurological: He is alert and   oriented to person, place, and time.     Assessment/Plan 66-year-old [place], non-English speaking  male patient well known to the Interventional Radiology service with  complex past medical history most significant for extensive  cardiovascular disease and chronic Hepatitis C virus who underwent  successful radiation lobectomy of the left lobe of the liver for  biopsy-proven hepatocellular carcinoma. Continued surveillance  imaging unfortunately demonstrates interval development of new  multifocal hepatocellular carcinoma within the remainder of the  right lobe of the liver. He presents today for repeat mapping arteriogram of right lobe of liver and test MAA dose prior to planned Y-90 radioembolization. Details/risks of procedure d/w pt via interpreter with his understanding and consent.   D Kevin Allred 02/26/2014, 9:12 AM    

## 2014-02-26 NOTE — Procedures (Signed)
Post planning Y-90 radioembolization.  No immediate complications.  Keep right leg straight for 4 hrs.

## 2014-02-26 NOTE — Discharge Instructions (Signed)
Moderate Sedation, Adult Moderate sedation is given to help you relax or even sleep through a procedure. You may remain sleepy, Rylee clumsy, or have poor balance for several hours following this procedure. Arrange for a responsible adult, family member, or friend to take you home. A responsible adult should stay with you for at least 24 hours or until the medicines have worn off.  Do not participate in any activities where you could become injured for the next 24 hours, or until you feel normal again. Do not:  Drive.  Swim.  Ride a bicycle.  Operate heavy machinery.  Cook.  Use power tools.  Climb ladders.  Work at [employer].  Do not make important decisions or sign legal documents until you are improved.  Vomiting may occur if you eat too soon. When you can drink without vomiting, try water, juice, or soup. Try solid foods if you feel little or no nausea.  Only take over-the-counter or prescription medications for pain, discomfort, or fever as directed by your caregiver.If pain medications have been prescribed for you, ask your caregiver how soon it is safe to take them.  Make sure you and your family fully understands everything about the medication given to you. Make sure you understand what side effects may occur.  You should not drink alcohol, take sleeping pills, or medications that cause drowsiness for at least 24 hours.  If you smoke, do not smoke alone.  If you are feeling better, you may resume normal activities 24 hours after receiving sedation.  Keep all appointments as scheduled. Follow all instructions.  Ask questions if you do not understand. SEEK MEDICAL CARE IF:   Your skin is pale or bluish in color.  You continue to feel sick to your stomach (nauseous) or throw up (vomit).  Your pain is getting worse and not helped by medication.  You have bleeding or swelling.  You are still sleepy or feeling clumsy after 24 hours. SEEK IMMEDIATE MEDICAL CARE IF:    You develop a rash.  You have difficulty breathing.  You develop any type of allergic problem.  You have a fever. Document Released: 06/08/2001 Document Revised: 12/06/2011 Document Reviewed: 05/21/2013 [facility] Patient Information 2014 [place]. Arteriogram Care After These instructions give you information on caring for yourself after your procedure. Your doctor may also give you more specific instructions. Call your doctor if you have any problems or questions after your procedure. HOME CARE  Stay in bed the rest of the day.  Keep your leg straight for at least 6 hours.  Do not lift anything heavier than 10 pounds (about a gallon of milk) for 2 days.  Do not walk a lot, run, or drive for 2 days.  Return to normal activities in 2 days or as told by your doctor. Finding out the results of your test Ask when your test results will Hashir ready. Make sure you get your test results. GET HELP RIGHT AWAY IF:   You have fever of 102 F (38.9 C) or higher.  You have more pain in your leg.  The leg that was cut is:  Bleeding.  Puffy (swollen) or red.  Cold.  Pale or changes color.  Weak.  Tingly or numb. If you go to the Emergency Room, tell your nurse that you have had an arteriogram. Take this paper with you to show the nurse. MAKE SURE YOU:  Understand these instructions.  Will watch your condition.  Will get help right away if you  are not doing well or get worse. Document Released: 12/10/2008 Document Revised: 12/06/2011 Document Reviewed: 12/10/2008 [facility] Patient Information 2014 [place], [place].

## 2014-02-26 NOTE — Sedation Documentation (Addendum)
5Fr sheath removed from R femoral artery by Dr. Pascal Lux.  Hemostasis achieved by direct manual pressure.  R groin level 1, 4+RDP.

## 2014-02-26 NOTE — Sedation Documentation (Signed)
Manual pressure applied to R fem art X25 mins.  Groin level 0, 4+RDP.  Gauze tegaderm bandage applied, CDI.

## 2014-02-27 ENCOUNTER — Encounter (HOSPITAL_COMMUNITY): Payer: Medicare HMO

## 2014-02-27 ENCOUNTER — Other Ambulatory Visit: Payer: Self-pay | Admitting: Interventional Radiology

## 2014-02-27 DIAGNOSIS — C220 Liver cell carcinoma: Secondary | ICD-10-CM

## 2014-03-11 ENCOUNTER — Other Ambulatory Visit: Payer: Self-pay | Admitting: Radiology

## 2014-03-14 ENCOUNTER — Other Ambulatory Visit: Payer: Self-pay | Admitting: Interventional Radiology

## 2014-03-14 ENCOUNTER — Encounter (HOSPITAL_COMMUNITY)
Admission: RE | Admit: 2014-03-14 | Discharge: 2014-03-14 | Disposition: A | Discharge status: Home / Self Care | Payer: Medicare HMO | Source: Ambulatory Visit | Attending: Interventional Radiology | Admitting: Interventional Radiology

## 2014-03-14 ENCOUNTER — Ambulatory Visit (HOSPITAL_COMMUNITY)
Admission: RE | Admit: 2014-03-14 | Discharge: 2014-03-14 | Disposition: A | Discharge status: Home / Self Care | Payer: Medicare HMO | Source: Ambulatory Visit | Attending: Interventional Radiology | Admitting: Interventional Radiology

## 2014-03-14 ENCOUNTER — Encounter (HOSPITAL_COMMUNITY): Payer: Self-pay

## 2014-03-14 DIAGNOSIS — C228 Malignant neoplasm of liver, primary, unspecified as to type: Secondary | ICD-10-CM | POA: Insufficient documentation

## 2014-03-14 DIAGNOSIS — C220 Liver cell carcinoma: Secondary | ICD-10-CM

## 2014-03-14 DIAGNOSIS — I428 Other cardiomyopathies: Secondary | ICD-10-CM | POA: Diagnosis not present

## 2014-03-14 DIAGNOSIS — Z7901 Long term (current) use of anticoagulants: Secondary | ICD-10-CM | POA: Diagnosis not present

## 2014-03-14 DIAGNOSIS — Z87891 Personal history of nicotine dependence: Secondary | ICD-10-CM | POA: Diagnosis not present

## 2014-03-14 DIAGNOSIS — K219 Gastro-esophageal reflux disease without esophagitis: Secondary | ICD-10-CM | POA: Insufficient documentation

## 2014-03-14 DIAGNOSIS — B182 Chronic viral hepatitis C: Secondary | ICD-10-CM | POA: Insufficient documentation

## 2014-03-14 DIAGNOSIS — Z954 Presence of other heart-valve replacement: Secondary | ICD-10-CM | POA: Insufficient documentation

## 2014-03-14 DIAGNOSIS — G4733 Obstructive sleep apnea (adult) (pediatric): Secondary | ICD-10-CM | POA: Insufficient documentation

## 2014-03-14 DIAGNOSIS — Z79899 Other long term (current) drug therapy: Secondary | ICD-10-CM | POA: Diagnosis not present

## 2014-03-14 DIAGNOSIS — I4891 Unspecified atrial fibrillation: Secondary | ICD-10-CM | POA: Diagnosis not present

## 2014-03-14 DIAGNOSIS — I10 Essential (primary) hypertension: Secondary | ICD-10-CM | POA: Diagnosis not present

## 2014-03-14 DIAGNOSIS — I2510 Atherosclerotic heart disease of native coronary artery without angina pectoris: Secondary | ICD-10-CM | POA: Diagnosis not present

## 2014-03-14 LAB — COMPREHENSIVE METABOLIC PANEL
ALT: 97 U/L — AB (ref 0–53)
AST: 72 U/L — ABNORMAL HIGH (ref 0–37)
Albumin: 3.6 g/dL (ref 3.5–5.2)
Alkaline Phosphatase: 139 U/L — ABNORMAL HIGH (ref 39–117)
BILIRUBIN TOTAL: 0.8 mg/dL (ref 0.3–1.2)
BUN: 15 mg/dL (ref 6–23)
CHLORIDE: 103 meq/L (ref 96–112)
CO2: 20 meq/L (ref 19–32)
Calcium: 9.3 mg/dL (ref 8.4–10.5)
Creatinine, Ser: 0.87 mg/dL (ref 0.50–1.35)
GFR, EST NON AFRICAN AMERICAN: 86 mL/min — AB (ref >90)
Glucose, Bld: 130 mg/dL — ABNORMAL HIGH (ref 70–99)
POTASSIUM: 4.3 meq/L (ref 3.7–5.3)
SODIUM: 138 meq/L (ref 137–147)
Total Protein: 9.3 g/dL — ABNORMAL HIGH (ref 6.0–8.3)

## 2014-03-14 LAB — PROTIME-INR
INR: 1.06 (ref 0.00–1.49)
Prothrombin Time: 13.6 seconds (ref 11.6–15.2)

## 2014-03-14 LAB — CBC
HCT: 46.5 % (ref 39.0–52.0)
HEMOGLOBIN: 15.9 g/dL (ref 13.0–17.0)
MCH: 35.1 pg — ABNORMAL HIGH (ref 26.0–34.0)
MCHC: 34.2 g/dL (ref 30.0–36.0)
MCV: 102.6 fL — ABNORMAL HIGH (ref 78.0–100.0)
Platelets: 159 10*3/uL (ref 150–400)
RBC: 4.53 MIL/uL (ref 4.22–5.81)
RDW: 13.5 % (ref 11.5–15.5)
WBC: 6.7 10*3/uL (ref 4.0–10.5)

## 2014-03-14 LAB — APTT: aPTT: 28 seconds (ref 24–37)

## 2014-03-14 MED ORDER — IOHEXOL 300 MG/ML  SOLN
80.0000 mL | Freq: Once | INTRAMUSCULAR | Status: AC | PRN
  Administered 2014-03-14: 80 mL via INTRA_ARTERIAL

## 2014-03-14 MED ORDER — MIDAZOLAM HCL 2 MG/2ML IJ SOLN
INTRAMUSCULAR | Status: AC
  Filled 2014-03-14: qty 8

## 2014-03-14 MED ORDER — MIDAZOLAM HCL 2 MG/2ML IJ SOLN
INTRAMUSCULAR | Status: AC | PRN
  Administered 2014-03-14: 1 mg via INTRAVENOUS
  Administered 2014-03-14: 2 mg via INTRAVENOUS
  Administered 2014-03-14: 1 mg via INTRAVENOUS

## 2014-03-14 MED ORDER — FENTANYL CITRATE 0.05 MG/ML IJ SOLN
INTRAMUSCULAR | Status: AC
  Filled 2014-03-14: qty 8

## 2014-03-14 MED ORDER — ONDANSETRON HCL 4 MG/2ML IJ SOLN
4.0000 mg | Freq: Once | INTRAMUSCULAR | Status: AC
  Administered 2014-03-14: 4 mg via INTRAVENOUS
  Filled 2014-03-14: qty 2

## 2014-03-14 MED ORDER — SODIUM CHLORIDE 0.9 % IV SOLN
INTRAVENOUS | Status: DC
  Administered 2014-03-14: 08:00:00 via INTRAVENOUS

## 2014-03-14 MED ORDER — PIPERACILLIN-TAZOBACTAM 3.375 G IVPB
3.3750 g | Freq: Once | INTRAVENOUS | Status: AC
  Administered 2014-03-14: 3.375 g via INTRAVENOUS
  Filled 2014-03-14: qty 50

## 2014-03-14 MED ORDER — YTTRIUM 90 INJECTION: 22.0000 | INJECTION | Freq: Once | INTRAVENOUS | Status: DC

## 2014-03-14 MED ORDER — DEXAMETHASONE SODIUM PHOSPHATE 10 MG/ML IJ SOLN
20.0000 mg | Freq: Once | INTRAMUSCULAR | Status: AC
  Administered 2014-03-14: 20 mg via INTRAVENOUS
  Filled 2014-03-14: qty 2

## 2014-03-14 MED ORDER — LIDOCAINE HCL 1 % IJ SOLN
INTRAMUSCULAR | Status: AC
  Filled 2014-03-14: qty 20

## 2014-03-14 MED ORDER — PANTOPRAZOLE SODIUM 40 MG IV SOLR
40.0000 mg | Freq: Once | INTRAVENOUS | Status: AC
  Administered 2014-03-14: 40 mg via INTRAVENOUS
  Filled 2014-03-14: qty 40

## 2014-03-14 MED ORDER — FENTANYL CITRATE 0.05 MG/ML IJ SOLN
INTRAMUSCULAR | Status: AC | PRN
  Administered 2014-03-14: 100 ug via INTRAVENOUS

## 2014-03-14 NOTE — Procedures (Signed)
Post Y-90 radio embolization of the right lobe of the liver for multifocal HCC.  No immediate complications.  Keep right leg straight for 4 hrs.

## 2014-03-14 NOTE — Discharge Instructions (Signed)
Leave dressing to right groin in place for 24 hours, then you may remove it and then you may shower.   Post Y-90 Radioembolization Discharge Instructions  You have been given a radioactive material during your procedure.  While it is safe for you to Hanish discharged home from the hospital, you need to proceed directly home.    Do not use public transportation, including air travel, lasting more than 2 hours for 1 week.  Avoid crowded public places for 1 week.  Adult visitors should try to avoid close contact with you for 1 week.    Children and pregnant females should not visit or have close contact with you for 1 week.  Items that you touch are not radioactive.  Do not sleep in the same bed as your partner for 1 week, and a condom should Cypress used for sexual activity during the first 24 hours.  Your blood may Taryn radioactive and caution should Seneca used if any bleeding occurs during the recovery period.  Body fluids may Ashir radioactive for 24 hours.  Wash your hands after voiding.  Men should sit to urinate.  Dispose of any soiled materials (flush down toilet or place in trash at home) during the first day.  Drink 6 to 8 glasses of fluids per day for 5 days to hydrate yourself.  If you need to see a doctor during the first week, you must let them know that you were treated with yttrium-90 microspheres, and will Denton slightly radioactive.  They can call Interventional Radiology [phone] with any questions.Conscious Sedation Sedation is the use of medicines to promote relaxation and relieve discomfort and anxiety. Conscious sedation is a type of sedation. Under conscious sedation you are less alert than normal but are still able to respond to instructions or stimulation. Conscious sedation is used during short medical and dental procedures. It is milder than deep sedation or general anesthesia and allows you to return to your regular activities sooner.  LET [facility] CARE PROVIDER KNOW ABOUT:   Any  allergies you have.  All medicines you are taking, including vitamins, herbs, eye drops, creams, and over-the-counter medicines.  Use of steroids (by mouth or creams).  Previous problems you or members of your family have had with the use of anesthetics.  Any blood disorders you have.  Previous surgeries you have had.  Medical conditions you have.  Possibility of pregnancy, if this applies.  Use of cigarettes, alcohol, or illegal drugs. RISKS AND COMPLICATIONS Generally, this is a safe procedure. However, as with any procedure, problems can occur. Possible problems include:  Oversedation.  Trouble breathing on your own. You may need to have a breathing tube until you are awake and breathing on your own.  Allergic reaction to any of the medicines used for the procedure. BEFORE THE PROCEDURE  You may have blood tests done. These tests can help show how well your kidneys and liver are working. They can also show how well your blood clots.  A physical exam will Joeanthony done.  Only take medicines as directed by your health care provider. You may need to stop taking medicines (such as blood thinners, aspirin, or nonsteroidal anti-inflammatory drugs) before the procedure.   Do not eat or drink at least 6 hours before the procedure or as directed by your health care provider.  Arrange for a responsible adult, family member, or friend to take you home after the procedure. He or she should stay with you for at least 24  hours after the procedure, until the medicine has worn off. PROCEDURE   An intravenous (IV) catheter will Roark inserted into one of your veins. Medicine will Kristy able to flow directly into your body through this catheter. You may Tirso given medicine through this tube to help prevent pain and help you relax.  The medical or dental procedure will Dmoni done. AFTER THE PROCEDURE  You will stay in a recovery area until the medicine has worn off. Your blood pressure and pulse will Lenville  checked.   Depending on the procedure you had, you may Judd allowed to go home when you can tolerate liquids and your pain is under control. Document Released: 06/08/2001 Document Revised: 09/18/2013 Document Reviewed: 05/21/2013 [facility] Patient Information 2015 [place], [place]. This information is not intended to replace advice given to you by your health care provider. Make sure you discuss any questions you have with your health care provider.

## 2014-03-14 NOTE — H&P (Signed)
Glen Pitts is an 68 y.o. male.   Chief Complaint: hepatocellular carcinoma HPI: 68 year old [place], non-English speaking  male patient well known to the Interventional Radiology service with  complex past medical history most significant for extensive  cardiovascular disease and chronic Hepatitis C virus who underwent  successful radiation lobectomy of the left lobe of the liver for  biopsy-proven hepatocellular carcinoma (05/09/2103). Continued surveillance  imaging unfortunately demonstrates interval development of new  multifocal hepatocellular carcinoma within the remainder of the  right lobe of the liver. The pt presents today for Y-90 hepatic radioembolization of residual tumor.   Past Medical History  Diagnosis Date  . Hypertension   . Benign prostatic hypertrophy   . History of esophageal reflux   . Mitral and aortic regurgitation   . Atrial fibrillation   . AAA (abdominal aortic aneurysm)   . Acute renal failure   . Acute systolic congestive heart failure   . Cheyne-Stokes respiration   . Cardiomyopathy   . Erectile dysfunction   . GERD (gastroesophageal reflux disease)   . Hematuria   . Hepatitis C virus   . Hepatocellular carcinoma   . Mitral valve disorder   . Obstructive sleep apnea   . Pleural effusion   . Dyspnea   . Benign hypertension   . Anticoagulant long-term use   . Rectal bleeding     Past Surgical History  Procedure Laterality Date  . Open heart surgery  2012  . Aortic valve replacement      History reviewed. No pertinent family history. Social History:  reports that he has quit smoking. His smoking use included Cigarettes. He smoked 0.00 packs per day for 0 years. He does not have any smokeless tobacco history on file. He reports that he drinks alcohol. He reports that he does not use illicit drugs.  Allergies: No Known Allergies  Current outpatient prescriptions:bimatoprost (LUMIGAN) 0.03 % ophthalmic solution, Place 1 drop into both eyes  at bedtime., Disp: , Rfl: ;  dorzolamide-timolol (COSOPT) 22.3-6.8 MG/ML ophthalmic solution, Place 1 drop into both eyes 2 (two) times daily., Disp: , Rfl: ;  furosemide (LASIX) 20 MG tablet, Take 20 mg by mouth 2 (two) times daily., Disp: , Rfl: ;  lisinopril (PRINIVIL,ZESTRIL) 5 MG tablet, Take 5 mg by mouth daily., Disp: , Rfl:  metoprolol succinate (TOPROL-XL) 25 MG 24 hr tablet, Take 50 mg by mouth daily., Disp: , Rfl: ;  warfarin (COUMADIN) 2.5 MG tablet, Take 1.25-2.5 mg by mouth daily. Takes 1.25 mg on Tues,Thurs, Sat.  Takes 2.5 mg all other days., Disp: , Rfl:  Current facility-administered medications:0.9 %  sodium chloride infusion, , Intravenous, Continuous, D Kevin Allred, PA-C, Last Rate: 75 mL/hr at 03/14/14 0745;  piperacillin-tazobactam (ZOSYN) IVPB 3.375 g, 3.375 g, Intravenous, Once, D Rowe Robert, PA-C   Results for orders placed during the hospital encounter of 03/14/14 (from the past 48 hour(s))  APTT     Status: None   Collection Time    03/14/14  7:45 AM      Result Value Ref Range   aPTT 28  24 - 37 seconds  CBC     Status: Abnormal   Collection Time    03/14/14  7:45 AM      Result Value Ref Range   WBC 6.7  4.0 - 10.5 K/uL   RBC 4.53  4.22 - 5.81 MIL/uL   Hemoglobin 15.9  13.0 - 17.0 g/dL   HCT 46.5  39.0 - 52.0 %   MCV  102.6 (*) 78.0 - 100.0 fL   MCH 35.1 (*) 26.0 - 34.0 pg   MCHC 34.2  30.0 - 36.0 g/dL   RDW 13.5  11.5 - 15.5 %   Platelets 159  150 - 400 K/uL  COMPREHENSIVE METABOLIC PANEL     Status: Abnormal   Collection Time    03/14/14  7:45 AM      Result Value Ref Range   Sodium 138  137 - 147 mEq/L   Potassium 4.3  3.7 - 5.3 mEq/L   Chloride 103  96 - 112 mEq/L   CO2 20  19 - 32 mEq/L   Glucose, Bld 130 (*) 70 - 99 mg/dL   BUN 15  6 - 23 mg/dL   Creatinine, Ser 0.87  0.50 - 1.35 mg/dL   Calcium 9.3  8.4 - 10.5 mg/dL   Total Protein 9.3 (*) 6.0 - 8.3 g/dL   Albumin 3.6  3.5 - 5.2 g/dL   AST 72 (*) 0 - 37 U/L   ALT 97 (*) 0 - 53 U/L    Alkaline Phosphatase 139 (*) 39 - 117 U/L   Total Bilirubin 0.8  0.3 - 1.2 mg/dL   GFR calc non Af Amer 86 (*) >90 mL/min   GFR calc Af Amer >90  >90 mL/min   Comment: (NOTE)     The eGFR has been calculated using the CKD EPI equation.     This calculation has not been validated in all clinical situations.     eGFR's persistently <90 mL/min signify possible Chronic Kidney     Disease.  PROTIME-INR     Status: None   Collection Time    03/14/14  7:45 AM      Result Value Ref Range   Prothrombin Time 13.6  11.6 - 15.2 seconds   INR 1.06  0.00 - 1.49   No results found.  Review of Systems  Constitutional: Positive for malaise/fatigue. Negative for fever and chills.  Respiratory: Negative for cough and shortness of breath.   Cardiovascular: Negative for chest pain.  Gastrointestinal: Positive for abdominal pain. Negative for nausea, vomiting and blood in stool.  Genitourinary: Negative for dysuria and hematuria.  Musculoskeletal: Positive for back pain.  Neurological: Positive for headaches.  Endo/Heme/Allergies: Does not bruise/bleed easily.    Blood pressure 126/81, pulse 95, temperature 97.5 F (36.4 C), temperature source Oral, resp. rate 18, SpO2 99.00%. Physical Exam  Constitutional: He is oriented to person, place, and time. He appears well-developed and well-nourished.  Cardiovascular:  irreg irreg  Respiratory: Effort normal and breath sounds normal.  GI: Soft. Bowel sounds are normal.  mild to mod epigastric tenderness  Musculoskeletal: Normal range of motion. He exhibits no edema.  Neurological: He is alert and oriented to person, place, and time.     Assessment/Plan 68 year old [place], non-English speaking  male patient well known to the Interventional Radiology service with  complex past medical history most significant for extensive  cardiovascular disease and chronic Hepatitis C virus who underwent  successful radiation lobectomy of the left lobe of the  liver for  biopsy-proven hepatocellular carcinoma (05/09/2103). Continued surveillance  imaging unfortunately demonstrates interval development of new  multifocal hepatocellular carcinoma within the remainder of the  right lobe of the liver. The pt presents today for Y-90 hepatic radioembolization of residual tumor. Details/risks of procedure d/w pt via interpreter with his understanding and consent.  ALLRED,D KEVIN 03/14/2014, 8:50 AM

## 2014-03-14 NOTE — Progress Notes (Signed)
Using interpreter, explained the Y90 instructions and precautions to the pt and the pt's son.  The pt lives with his son.  Pt questions/concerns were addressed and answered using the interpreter.

## 2014-04-03 ENCOUNTER — Other Ambulatory Visit: Payer: Self-pay | Admitting: Radiology

## 2014-04-03 ENCOUNTER — Other Ambulatory Visit (HOSPITAL_COMMUNITY): Payer: Self-pay | Admitting: Interventional Radiology

## 2014-04-03 DIAGNOSIS — C220 Liver cell carcinoma: Secondary | ICD-10-CM

## 2014-04-04 ENCOUNTER — Other Ambulatory Visit: Payer: Self-pay | Admitting: Emergency Medicine

## 2014-04-04 DIAGNOSIS — C220 Liver cell carcinoma: Secondary | ICD-10-CM

## 2014-04-23 ENCOUNTER — Ambulatory Visit
Admission: RE | Admit: 2014-04-23 | Discharge: 2014-04-23 | Disposition: A | Discharge status: Home / Self Care | Payer: Medicare HMO | Source: Ambulatory Visit | Attending: Interventional Radiology | Admitting: Interventional Radiology

## 2014-04-23 DIAGNOSIS — C220 Liver cell carcinoma: Secondary | ICD-10-CM

## 2014-04-23 NOTE — Progress Notes (Signed)
Appetite:  Poor-fair.  States that he has lost about 5 lbs.  Complaining of bloating, abdominal discomfort.  Occasional episodes of nausea.  Denies vomiting or diarrhea.    Fatigues easily.  Able to take care of personal hygiene and dressing without assistance.    Sleeping disturbances due to nocturia, 5-6 time/night.

## 2014-06-20 ENCOUNTER — Other Ambulatory Visit: Payer: Self-pay | Admitting: Emergency Medicine

## 2014-06-20 DIAGNOSIS — C228 Malignant neoplasm of liver, primary, unspecified as to type: Secondary | ICD-10-CM

## 2014-06-26 ENCOUNTER — Other Ambulatory Visit: Payer: Self-pay | Admitting: Emergency Medicine

## 2014-06-26 DIAGNOSIS — C228 Malignant neoplasm of liver, primary, unspecified as to type: Secondary | ICD-10-CM

## 2014-07-28 DEATH — deceased

## 2014-08-27 DEATH — deceased

## 2014-09-18 ENCOUNTER — Encounter: Payer: Self-pay | Admitting: Emergency Medicine

## 2015-09-25 IMAGING — NM NM MISC PROCEDURE
4 series · 24 of 24 positions shown · non-contrast
Comparison: MRI 12/28/2013, [AGE] therapy 05/08/2013

CLINICAL DATA: Multifocal hepatic cellular carcinoma unresectable.
Prior 90 therapy to the left hepatic lobe.

EXAM:
NUCLEAR MEDICINE LIVER SCAN; ULTRASOUND MISCELLANEOUS SOFT TISSUE
TECHNIQUE: Abdominal images were obtained in multiple projections after
intrahepatic arterial injection of radiopharmaceutical. SPECT
imaging was performed. Lung shunt calculation was performed.
RADIOPHARMACEUTICALS:  [id number] ABIMELK TIGER TECHNETIUM TO 99M ALBUMIN
AGGREGATED

[Series 1: mc (age) micro · 4.7mm · 4.75mm/px · 6 of 91 frames shown (1 of 4)]
[frame 8/91]
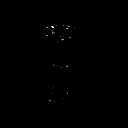
[frame 23/91]
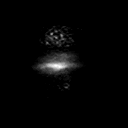
[frame 38/91]
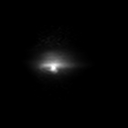
[frame 53/91]
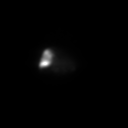
[frame 68/91]
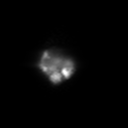
[frame 84/91]
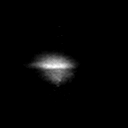

[Series 1: mc (age) micro · 4.75mm/px · 6 of 64 frames shown (2 of 4)]
[frame 6/64]
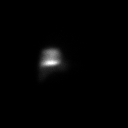
[frame 16/64]
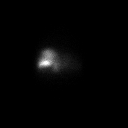
[frame 27/64]
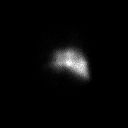
[frame 38/64]
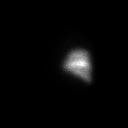
[frame 48/64]
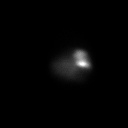
[frame 59/64]
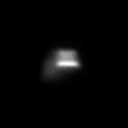

[Series 1: mc (age) micro · 4.7mm · 4.75mm/px · 6 of 91 frames shown (3 of 4)]
[frame 8/91]
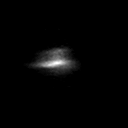
[frame 23/91]
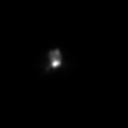
[frame 38/91]
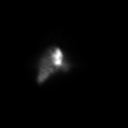
[frame 53/91]
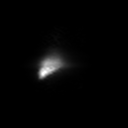
[frame 68/91]
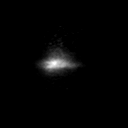
[frame 84/91]
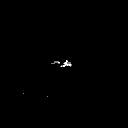

[Series 1: mc (age) micro · 4.7mm · 4.75mm/px · 6 of 91 frames shown (4 of 4)]
[frame 8/91]
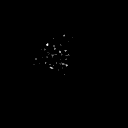
[frame 23/91]
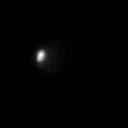
[frame 38/91]
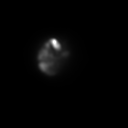
[frame 53/91]
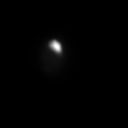
[frame 68/91]
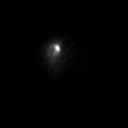
[frame 84/91]
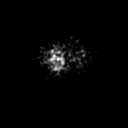

[24 of 24 positions shown; findings below may reference images not displayed]

FINDINGS: The injected microaggregated albumin localizes within the liver. No
evidence of activity within the stomach, duodenum, or bowel.

Calculated shunt fraction to the lungs equals 1.4%.
IMPRESSION: 1. No significant extrahepatic radiotracer activity following
intrahepatic arterial injection of MAA.
2. Lung shunt fraction equals 1.4%
